# Patient Record
Sex: Female | Born: 1965
Health system: Southern US, Community
[De-identification: ages and names within clinical notes are randomized; demographics above are authoritative.]

## PROBLEM LIST (undated history)

## (undated) DIAGNOSIS — Z8709 Personal history of other diseases of the respiratory system: Secondary | ICD-10-CM

## (undated) DIAGNOSIS — F32A Depression, unspecified: Secondary | ICD-10-CM

## (undated) DIAGNOSIS — R05 Cough: Secondary | ICD-10-CM

## (undated) DIAGNOSIS — F329 Major depressive disorder, single episode, unspecified: Secondary | ICD-10-CM

## (undated) DIAGNOSIS — R0981 Nasal congestion: Secondary | ICD-10-CM

## (undated) DIAGNOSIS — S8263XA Displaced fracture of lateral malleolus of unspecified fibula, initial encounter for closed fracture: Secondary | ICD-10-CM

## (undated) HISTORY — PX: TUBAL LIGATION: SHX77

---

## 2009-05-14 ENCOUNTER — Emergency Department (HOSPITAL_BASED_OUTPATIENT_CLINIC_OR_DEPARTMENT_OTHER): Admission: EM | Admit: 2009-05-14 | Discharge: 2009-05-14 | Payer: Self-pay | Admitting: Emergency Medicine

## 2010-05-31 ENCOUNTER — Emergency Department (HOSPITAL_BASED_OUTPATIENT_CLINIC_OR_DEPARTMENT_OTHER)
Admission: EM | Admit: 2010-05-31 | Discharge: 2010-05-31 | Disposition: A | Discharge status: Home / Self Care | Payer: Self-pay | Attending: Emergency Medicine | Admitting: Emergency Medicine

## 2010-05-31 DIAGNOSIS — Y9239 Other specified sports and athletic area as the place of occurrence of the external cause: Secondary | ICD-10-CM | POA: Insufficient documentation

## 2010-05-31 DIAGNOSIS — Y92838 Other recreation area as the place of occurrence of the external cause: Secondary | ICD-10-CM | POA: Insufficient documentation

## 2010-05-31 DIAGNOSIS — IMO0002 Reserved for concepts with insufficient information to code with codable children: Secondary | ICD-10-CM | POA: Insufficient documentation

## 2010-05-31 DIAGNOSIS — Y93A1 Activity, exercise machines primarily for cardiorespiratory conditioning: Secondary | ICD-10-CM | POA: Insufficient documentation

## 2010-05-31 DIAGNOSIS — X503XXA Overexertion from repetitive movements, initial encounter: Secondary | ICD-10-CM | POA: Insufficient documentation

## 2010-06-10 LAB — WET PREP, GENITAL: Yeast Wet Prep HPF POC: NONE SEEN

## 2010-06-10 LAB — URINALYSIS, ROUTINE W REFLEX MICROSCOPIC
Hgb urine dipstick: NEGATIVE
Specific Gravity, Urine: 1.021 (ref 1.005–1.030)
Urobilinogen, UA: 1 mg/dL (ref 0.0–1.0)
pH: 6 (ref 5.0–8.0)

## 2010-06-10 LAB — GC/CHLAMYDIA PROBE AMP, GENITAL
Chlamydia, DNA Probe: NEGATIVE
GC Probe Amp, Genital: NEGATIVE

## 2012-05-09 ENCOUNTER — Emergency Department (HOSPITAL_BASED_OUTPATIENT_CLINIC_OR_DEPARTMENT_OTHER)
Admission: EM | Admit: 2012-05-09 | Discharge: 2012-05-09 | Disposition: A | Discharge status: Home / Self Care | Payer: Self-pay | Attending: Emergency Medicine | Admitting: Emergency Medicine

## 2012-05-09 ENCOUNTER — Encounter (HOSPITAL_BASED_OUTPATIENT_CLINIC_OR_DEPARTMENT_OTHER): Payer: Self-pay

## 2012-05-09 DIAGNOSIS — IMO0002 Reserved for concepts with insufficient information to code with codable children: Secondary | ICD-10-CM | POA: Insufficient documentation

## 2012-05-09 DIAGNOSIS — H53149 Visual discomfort, unspecified: Secondary | ICD-10-CM | POA: Insufficient documentation

## 2012-05-09 DIAGNOSIS — H5789 Other specified disorders of eye and adnexa: Secondary | ICD-10-CM | POA: Insufficient documentation

## 2012-05-09 DIAGNOSIS — H1130 Conjunctival hemorrhage, unspecified eye: Secondary | ICD-10-CM | POA: Insufficient documentation

## 2012-05-09 DIAGNOSIS — Y9389 Activity, other specified: Secondary | ICD-10-CM | POA: Insufficient documentation

## 2012-05-09 DIAGNOSIS — H539 Unspecified visual disturbance: Secondary | ICD-10-CM | POA: Insufficient documentation

## 2012-05-09 DIAGNOSIS — Y929 Unspecified place or not applicable: Secondary | ICD-10-CM | POA: Insufficient documentation

## 2012-05-09 MED ORDER — FLUORESCEIN SODIUM 1 MG OP STRP
ORAL_STRIP | OPHTHALMIC | Status: AC
  Administered 2012-05-09: 12:00:00
  Filled 2012-05-09: qty 1

## 2012-05-09 MED ORDER — TETRACAINE HCL 0.5 % OP SOLN
OPHTHALMIC | Status: AC
  Administered 2012-05-09: 12:00:00
  Filled 2012-05-09: qty 2
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
## 2012-05-09 NOTE — ED Notes (Signed)
Supplies placed at bedside for md exam. md at bedside.
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
## 2012-07-20 ENCOUNTER — Encounter (HOSPITAL_BASED_OUTPATIENT_CLINIC_OR_DEPARTMENT_OTHER): Payer: Self-pay | Admitting: *Deleted

## 2012-07-20 ENCOUNTER — Emergency Department (HOSPITAL_BASED_OUTPATIENT_CLINIC_OR_DEPARTMENT_OTHER)
Admission: EM | Admit: 2012-07-20 | Discharge: 2012-07-21 | Disposition: A | Discharge status: Home / Self Care | Payer: Self-pay | Attending: Emergency Medicine | Admitting: Emergency Medicine

## 2012-07-20 DIAGNOSIS — R072 Precordial pain: Secondary | ICD-10-CM | POA: Insufficient documentation

## 2012-07-20 DIAGNOSIS — R110 Nausea: Secondary | ICD-10-CM | POA: Insufficient documentation

## 2012-07-20 DIAGNOSIS — R350 Frequency of micturition: Secondary | ICD-10-CM | POA: Insufficient documentation

## 2012-07-20 DIAGNOSIS — R1013 Epigastric pain: Secondary | ICD-10-CM | POA: Insufficient documentation

## 2012-07-20 DIAGNOSIS — G8929 Other chronic pain: Secondary | ICD-10-CM | POA: Insufficient documentation

## 2012-07-20 DIAGNOSIS — R0602 Shortness of breath: Secondary | ICD-10-CM | POA: Insufficient documentation

## 2012-07-20 DIAGNOSIS — R61 Generalized hyperhidrosis: Secondary | ICD-10-CM | POA: Insufficient documentation

## 2012-07-20 DIAGNOSIS — Z880 Allergy status to penicillin: Secondary | ICD-10-CM | POA: Insufficient documentation

## 2012-07-20 DIAGNOSIS — Z3202 Encounter for pregnancy test, result negative: Secondary | ICD-10-CM | POA: Insufficient documentation

## 2012-07-20 DIAGNOSIS — R079 Chest pain, unspecified: Secondary | ICD-10-CM

## 2012-07-20 DIAGNOSIS — M25519 Pain in unspecified shoulder: Secondary | ICD-10-CM | POA: Insufficient documentation

## 2012-07-20 NOTE — ED Notes (Signed)
Pt states she had just driven back from [facility] and was trying to lie down and go to sleep, but could not. Felt pressure in chest. +nausea. Under stress. No other s/s. Cycle was 3 weeks late. Hx tubal ligation.

## 2012-07-21 ENCOUNTER — Emergency Department (HOSPITAL_BASED_OUTPATIENT_CLINIC_OR_DEPARTMENT_OTHER): Payer: Self-pay

## 2012-07-21 LAB — RAPID URINE DRUG SCREEN, HOSP PERFORMED
Amphetamines: NOT DETECTED
Barbiturates: NOT DETECTED
Benzodiazepines: NOT DETECTED
Cocaine: NOT DETECTED

## 2012-07-21 LAB — URINE MICROSCOPIC-ADD ON

## 2012-07-21 LAB — CBC WITH DIFFERENTIAL/PLATELET
Basophils Relative: 0 % (ref 0–1)
Eosinophils Absolute: 0.2 10*3/uL (ref 0.0–0.7)
HCT: 35.3 % — ABNORMAL LOW (ref 36.0–46.0)
Hemoglobin: 12 g/dL (ref 12.0–15.0)
MCH: 29.9 pg (ref 26.0–34.0)
MCHC: 34 g/dL (ref 30.0–36.0)
Monocytes Absolute: 0.3 10*3/uL (ref 0.1–1.0)
Monocytes Relative: 5 % (ref 3–12)
Neutrophils Relative %: 62 % (ref 43–77)

## 2012-07-21 LAB — COMPREHENSIVE METABOLIC PANEL
Albumin: 3.4 g/dL — ABNORMAL LOW (ref 3.5–5.2)
BUN: 9 mg/dL (ref 6–23)
Creatinine, Ser: 0.8 mg/dL (ref 0.50–1.10)
Total Protein: 7.7 g/dL (ref 6.0–8.3)

## 2012-07-21 LAB — URINALYSIS, ROUTINE W REFLEX MICROSCOPIC
Bilirubin Urine: NEGATIVE
Nitrite: NEGATIVE
Specific Gravity, Urine: 1.007 (ref 1.005–1.030)
Urobilinogen, UA: 1 mg/dL (ref 0.0–1.0)

## 2012-07-21 LAB — PREGNANCY, URINE: Preg Test, Ur: NEGATIVE

## 2012-07-21 LAB — LIPASE, BLOOD: Lipase: 31 U/L (ref 11–59)

## 2012-07-21 LAB — TROPONIN I
Troponin I: <0.3 ng/mL (ref <0.30)
Troponin I: <0.3 ng/mL (ref <0.30)

## 2012-07-21 MED ORDER — GI COCKTAIL ~~LOC~~
30.0000 mL | Freq: Once | ORAL | Status: AC
  Administered 2012-07-21: 30 mL via ORAL
  Filled 2012-07-21: qty 30

## 2012-07-21 MED ORDER — ASPIRIN 81 MG PO CHEW
324.0000 mg | CHEWABLE_TABLET | Freq: Once | ORAL | Status: AC
  Administered 2012-07-21: 324 mg via ORAL
  Filled 2012-07-21: qty 4

## 2012-07-21 MED ORDER — METAXALONE 800 MG PO TABS: 800.0000 mg | ORAL_TABLET | Freq: Three times a day (TID) | ORAL | Status: DC | PRN

## 2012-07-21 NOTE — ED Provider Notes (Signed)
History  This chart was scribed for Hanley Seamen, MD, by Candelaria Stagers, ED Scribe. This patient was seen in room MH06/MH06 and the patient's care was started at 12:29 AM   CSN: [id number]  Arrival date & time 07/20/12  2308   First MD Initiated Contact with Patient 07/21/12 0028      Chief Complaint  Patient presents with  . Chest Pain     The history is provided by the patient. No language interpreter was used.   Erica Cisneros is a 47 y.o. female who presents to the Emergency Department via EMS complaining of an episode of vaguely-characterized epigastric and substernal chest pain that started about two hours ago and has now subsided. It was moderate in severity. She reports she also experienced transient nausea, SOB and diaphoresis when the chest pain began. She was lying down when the pain started. She denies emesis. Pt is also complaining of chronic right shoulder pain, worse with movement, that started several weeks ago and is worse at night, waking her from her sleep. Pt reports nothing made the chest pain better or worse. Pt reports she has been more stressed than usual.She states her menstrual period is 3 weeks late.   History reviewed. No pertinent past medical history.  Past Surgical History  Procedure Laterality Date  . Tubal ligation      History reviewed. No pertinent family history.  History  Substance Use Topics  . Smoking status: Never Smoker   . Smokeless tobacco: Not on file  . Alcohol Use: Yes     Comment: social    OB History   Grav Para Term Preterm Abortions TAB SAB Ect Mult Living                  Review of Systems  Constitutional:       Frequent thirst.   Genitourinary: Positive for frequency.  Musculoskeletal: Positive for arthralgias (chronic pain in right shoulder worse at night).  All other systems reviewed and are negative.    Allergies  Penicillins  Home Medications  No current outpatient prescriptions on file.  BP 136/71   Pulse 68  Temp(Src) 98.4 F (36.9 C) (Oral)  Resp 20  Ht 5\' 3"  (1.6 m)  Wt 170 lb (77.111 kg)  BMI 30.12 kg/m2  SpO2 98%  LMP 07/12/2012  Physical Exam  Nursing note and vitals reviewed. Constitutional: She is oriented to person, place, and time. She appears well-developed and well-nourished. No distress.  HENT:  Head: Normocephalic and atraumatic.  Eyes: Conjunctivae are normal. Right eye exhibits no discharge. Left eye exhibits no discharge.  Neck: Normal range of motion. Neck supple.  Pulmonary/Chest: Effort normal. No respiratory distress. She has no wheezes. She has no rales.  Abdominal: There is tenderness (epigastric tenderness).  Musculoskeletal:  Pain in right shoulder with passive movement.   Neurological: She is alert and oriented to person, place, and time.  Skin: Skin is warm and dry.  Psychiatric:  Circumstantial     ED Course  Procedures   DIAGNOSTIC STUDIES: Oxygen Saturation is 98% on room air, normal by my interpretation.    COORDINATION OF CARE:  12:37 AM Discussed course of care with pt.  Pt understands and agrees.      MDM   Nursing notes and vitals signs, including pulse oximetry, reviewed.  Summary of this visit's results, reviewed by myself:  Labs:  Results for orders placed during the hospital encounter of 07/20/12 (from the past 24 hour(s))  CBC  WITH DIFFERENTIAL     Status: Abnormal   Collection Time    07/21/12  1:15 AM      Result Value Range   WBC 5.5  4.0 - 10.5 K/uL   RBC 4.02  3.87 - 5.11 MIL/uL   Hemoglobin 12.0  12.0 - 15.0 g/dL   HCT 40.9 (*) 81.1 - 91.4 %   MCV 87.8  78.0 - 100.0 fL   MCH 29.9  26.0 - 34.0 pg   MCHC 34.0  30.0 - 36.0 g/dL   RDW 78.2  95.6 - 21.3 %   Platelets 197  150 - 400 K/uL   Neutrophils Relative 62  43 - 77 %   Neutro Abs 3.4  1.7 - 7.7 K/uL   Lymphocytes Relative 29  12 - 46 %   Lymphs Abs 1.6  0.7 - 4.0 K/uL   Monocytes Relative 5  3 - 12 %   Monocytes Absolute 0.3  0.1 - 1.0 K/uL    Eosinophils Relative 4  0 - 5 %   Eosinophils Absolute 0.2  0.0 - 0.7 K/uL   Basophils Relative 0  0 - 1 %   Basophils Absolute 0.0  0.0 - 0.1 K/uL  COMPREHENSIVE METABOLIC PANEL     Status: Abnormal   Collection Time    07/21/12  1:15 AM      Result Value Range   Sodium 137  135 - 145 mEq/L   Potassium 3.7  3.5 - 5.1 mEq/L   Chloride 102  96 - 112 mEq/L   CO2 25  19 - 32 mEq/L   Glucose, Bld 106 (*) 70 - 99 mg/dL   BUN 9  6 - 23 mg/dL   Creatinine, Ser 0.86  0.50 - 1.10 mg/dL   Calcium 8.7  8.4 - 57.8 mg/dL   Total Protein 7.7  6.0 - 8.3 g/dL   Albumin 3.4 (*) 3.5 - 5.2 g/dL   AST 22  0 - 37 U/L   ALT 15  0 - 35 U/L   Alkaline Phosphatase 66  39 - 117 U/L   Total Bilirubin 0.2 (*) 0.3 - 1.2 mg/dL   GFR calc non Af Amer 86 (*) >90 mL/min   GFR calc Af Amer >90  >90 mL/min  TROPONIN I     Status: None   Collection Time    07/21/12  1:15 AM      Result Value Range   Troponin I <0.30  <0.30 ng/mL  LIPASE, BLOOD     Status: None   Collection Time    07/21/12  1:15 AM      Result Value Range   Lipase 31  11 - 59 U/L  URINALYSIS, ROUTINE W REFLEX MICROSCOPIC     Status: Abnormal   Collection Time    07/21/12  1:15 AM      Result Value Range   Color, Urine YELLOW  YELLOW   APPearance CLEAR  CLEAR   Specific Gravity, Urine 1.007  1.005 - 1.030   pH 7.0  5.0 - 8.0   Glucose, UA NEGATIVE  NEGATIVE mg/dL   Hgb urine dipstick TRACE (*) NEGATIVE   Bilirubin Urine NEGATIVE  NEGATIVE   Ketones, ur NEGATIVE  NEGATIVE mg/dL   Protein, ur NEGATIVE  NEGATIVE mg/dL   Urobilinogen, UA 1.0  0.0 - 1.0 mg/dL   Nitrite NEGATIVE  NEGATIVE   Leukocytes, UA NEGATIVE  NEGATIVE  PREGNANCY, URINE     Status: None   Collection Time  07/21/12  1:15 AM      Result Value Range   Preg Test, Ur NEGATIVE  NEGATIVE  URINE RAPID DRUG SCREEN (HOSP PERFORMED)     Status: None   Collection Time    07/21/12  1:15 AM      Result Value Range   Opiates NONE DETECTED  NONE DETECTED   Cocaine NONE  DETECTED  NONE DETECTED   Benzodiazepines NONE DETECTED  NONE DETECTED   Amphetamines NONE DETECTED  NONE DETECTED   Tetrahydrocannabinol NONE DETECTED  NONE DETECTED   Barbiturates NONE DETECTED  NONE DETECTED  URINE MICROSCOPIC-ADD ON     Status: Abnormal   Collection Time    07/21/12  1:15 AM      Result Value Range   Squamous Epithelial / LPF FEW (*) RARE   WBC, UA 0-2  <3 WBC/hpf   RBC / HPF 0-2  <3 RBC/hpf   Bacteria, UA RARE  RARE  TROPONIN I     Status: None   Collection Time    07/21/12  3:06 AM      Result Value Range   Troponin I <0.30  <0.30 ng/mL    Imaging Studies: Dg Chest 2 View  07/21/2012  *RADIOLOGY REPORT*  Clinical Data: Chest pain  CHEST - 2 VIEW  Comparison: None.  Findings: Hypoaeration with interstitial and vascular crowding. No confluent airspace opacity, pleural effusion, or pneumothorax. Heart size within normal limits for inspiratory technique. No acute osseous finding.  IMPRESSION: Hypoaeration without confluent airspace opacity.   Original Report Authenticated By: Jearld Lesch, M.D.    3:44 AM  the patient has been asymptomatic in the ED except her for her right shoulder which as noted is a chronic problem. She was advised that we recommended admission for cardiac workup. She states she is unable to do this but would be willing to followup as an outpatient. Will refer her to [facility] cardiology. As with any patient leaving AGAINST MEDICAL ADVICE she was advised that she should feel free to return at any time should she feel worse or change her mind. She was advised that this chest pain may represent a serious problem that could be potentially be life threatening.     Date: 05/032014 1116M  Rate: 72 Rhythm: normal sinus rhythm  QRS Axis: normal  Intervals: normal  ST/T Wave abnormalities: normal  Conduction Disutrbances: none  Narrative Interpretation: unremarkable  Comparison with previous EKG: unchanged       I personally performed the  services described in this documentation, which was scribed in my presence.  The recorded information has been reviewed and is accurate.       Hanley Seamen, MD 07/21/12 [phone]

## 2014-10-16 ENCOUNTER — Encounter (HOSPITAL_BASED_OUTPATIENT_CLINIC_OR_DEPARTMENT_OTHER): Payer: Self-pay

## 2014-10-16 ENCOUNTER — Emergency Department (HOSPITAL_BASED_OUTPATIENT_CLINIC_OR_DEPARTMENT_OTHER)
Admission: EM | Admit: 2014-10-16 | Discharge: 2014-10-16 | Disposition: A | Discharge status: Home / Self Care | Payer: No Typology Code available for payment source | Attending: Emergency Medicine | Admitting: Emergency Medicine

## 2014-10-16 DIAGNOSIS — S46911A Strain of unspecified muscle, fascia and tendon at shoulder and upper arm level, right arm, initial encounter: Secondary | ICD-10-CM | POA: Insufficient documentation

## 2014-10-16 DIAGNOSIS — Z880 Allergy status to penicillin: Secondary | ICD-10-CM | POA: Diagnosis not present

## 2014-10-16 DIAGNOSIS — Z8659 Personal history of other mental and behavioral disorders: Secondary | ICD-10-CM | POA: Diagnosis not present

## 2014-10-16 DIAGNOSIS — Y9289 Other specified places as the place of occurrence of the external cause: Secondary | ICD-10-CM | POA: Insufficient documentation

## 2014-10-16 DIAGNOSIS — Y9389 Activity, other specified: Secondary | ICD-10-CM | POA: Diagnosis not present

## 2014-10-16 DIAGNOSIS — X58XXXA Exposure to other specified factors, initial encounter: Secondary | ICD-10-CM | POA: Diagnosis not present

## 2014-10-16 DIAGNOSIS — Y998 Other external cause status: Secondary | ICD-10-CM | POA: Insufficient documentation

## 2014-10-16 DIAGNOSIS — S4991XA Unspecified injury of right shoulder and upper arm, initial encounter: Secondary | ICD-10-CM | POA: Diagnosis present

## 2014-10-16 HISTORY — DX: Depression, unspecified: F32.A

## 2014-10-16 HISTORY — DX: Major depressive disorder, single episode, unspecified: F32.9

## 2014-10-16 MED ORDER — CYCLOBENZAPRINE HCL 10 MG PO TABS
5.0000 mg | ORAL_TABLET | Freq: Once | ORAL | Status: AC
  Administered 2014-10-16: 5 mg via ORAL
  Filled 2014-10-16: qty 1

## 2014-10-16 MED ORDER — DICLOFENAC SODIUM 1 % TD GEL: 4.0000 g | Freq: Four times a day (QID) | TRANSDERMAL | Status: DC

## 2014-10-16 MED ORDER — CYCLOBENZAPRINE HCL 5 MG PO TABS: 5.0000 mg | ORAL_TABLET | Freq: Three times a day (TID) | ORAL | Status: DC | PRN

## 2014-10-16 NOTE — Discharge Instructions (Signed)
Take motrin for pain.   Take flexeril for muscle spasms.  Continue voltaren cream.   Follow up with Dr. Pearletha Forge for treatment options.   No heavy lifting.   Return to ER if you have worse shoulder pain, weakness, numbness.

## 2014-10-16 NOTE — ED Provider Notes (Signed)
CSN: [id number]     Arrival date & time 10/16/14  0726 History   First MD Initiated Contact with Patient 10/16/14 [phone]     Chief Complaint  Patient presents with  . Shoulder Pain     (Consider location/radiation/quality/duration/timing/severity/associated sxs/prior Treatment) The history is provided by the patient.  Erica Cisneros is a 49 y.o. female who presenting with right shoulder pain. Pain has been going on for the last 4 months. It is worse when she wakes up and gets better during the day. Some times radiates to her back and neck. Denies any fall or injury. She helped sister lift couch yesterday and had more pain. Has been seeing her doctor and called her doctor yesterday with prescribed muscle relaxant but she did not fill it. Has been taking Motrin with minimal relief. The chest pain or numbness or weakness.     Past Medical History  Diagnosis Date  . Depression    Past Surgical History  Procedure Laterality Date  . Tubal ligation     No family history on file. History  Substance Use Topics  . Smoking status: Never Smoker   . Smokeless tobacco: Not on file  . Alcohol Use: Yes     Comment: occ   OB History    No data available     Review of Systems  Musculoskeletal:       R shoulder pain       Allergies  Penicillins  Home Medications   Prior to Admission medications   Medication Sig Start Date End Date Taking? Authorizing Provider  metaxalone (SKELAXIN) 800 MG tablet Take 1 tablet (800 mg total) by mouth 3 (three) times daily as needed (for muscle spasms). 07/21/12   John Molpus, MD   BP 159/94 mmHg  Pulse 70  Temp(Src) 98.5 F (36.9 C) (Oral)  Resp 18  Ht  (1.575 m)  Wt 185 lb (83.915 kg)  BMI 33.83 kg/m2  SpO2 100%  LMP 05/19/2014 Physical Exam  Constitutional: She is oriented to person, place, and time. She appears well-developed.  Uncomfortable   HENT:  Head: Normocephalic.  Mouth/Throat: Oropharynx is clear and moist.  Eyes: Pupils are  equal, round, and reactive to light.  Neck: Normal range of motion.  Cardiovascular: Normal rate.   Pulmonary/Chest: Effort normal and breath sounds normal.  Abdominal: Soft. Bowel sounds are normal.  Musculoskeletal:  R shoulder minimal tenderness on the joint. No deformity. Nl ROM. + trapezius tenderness. 2+ radial pulse, nl neurovascular exam R UE   Neurological: She is alert and oriented to person, place, and time. No cranial nerve deficit. Coordination normal.  Skin: Skin is warm and dry.  Psychiatric: She has a normal mood and affect. Her behavior is normal. Judgment and thought content normal.  Nursing note and vitals reviewed.   ED Course  Procedures (including critical care time) Labs Review Labs Reviewed - No data to display  Imaging Review No results found.   EKG Interpretation None      MDM   Final diagnoses:  None    Erica Cisneros is a 49 y.o. female here with R shoulder pain. Likely muscle strain vs bursitis. Recommend continue motrin. Will add flexeril. Patient requests Voltaren cream for comfort. Will refer to ortho for injections.      Richardean Canal, MD 10/16/14 [phone]

## 2014-10-16 NOTE — ED Notes (Signed)
MD at bedside. 
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
## 2015-02-12 ENCOUNTER — Encounter (HOSPITAL_BASED_OUTPATIENT_CLINIC_OR_DEPARTMENT_OTHER): Payer: Self-pay

## 2015-02-12 ENCOUNTER — Emergency Department (HOSPITAL_BASED_OUTPATIENT_CLINIC_OR_DEPARTMENT_OTHER)
Admission: EM | Admit: 2015-02-12 | Discharge: 2015-02-12 | Disposition: A | Discharge status: Home / Self Care | Payer: No Typology Code available for payment source | Attending: Emergency Medicine | Admitting: Emergency Medicine

## 2015-02-12 DIAGNOSIS — Z8659 Personal history of other mental and behavioral disorders: Secondary | ICD-10-CM | POA: Diagnosis not present

## 2015-02-12 DIAGNOSIS — Y998 Other external cause status: Secondary | ICD-10-CM | POA: Insufficient documentation

## 2015-02-12 DIAGNOSIS — M545 Low back pain, unspecified: Secondary | ICD-10-CM

## 2015-02-12 DIAGNOSIS — S46911A Strain of unspecified muscle, fascia and tendon at shoulder and upper arm level, right arm, initial encounter: Secondary | ICD-10-CM | POA: Insufficient documentation

## 2015-02-12 DIAGNOSIS — Y92410 Unspecified street and highway as the place of occurrence of the external cause: Secondary | ICD-10-CM | POA: Diagnosis not present

## 2015-02-12 DIAGNOSIS — S3992XA Unspecified injury of lower back, initial encounter: Secondary | ICD-10-CM | POA: Insufficient documentation

## 2015-02-12 DIAGNOSIS — Z880 Allergy status to penicillin: Secondary | ICD-10-CM | POA: Diagnosis not present

## 2015-02-12 DIAGNOSIS — Y9389 Activity, other specified: Secondary | ICD-10-CM | POA: Insufficient documentation

## 2015-02-12 MED ORDER — METHOCARBAMOL 500 MG PO TABS: 1000.0000 mg | ORAL_TABLET | Freq: Four times a day (QID) | ORAL | Status: DC
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
## 2015-02-12 NOTE — ED Notes (Signed)
PA at bedside.

## 2016-04-22 ENCOUNTER — Encounter (HOSPITAL_BASED_OUTPATIENT_CLINIC_OR_DEPARTMENT_OTHER): Payer: Self-pay | Admitting: Emergency Medicine

## 2016-04-22 ENCOUNTER — Emergency Department (HOSPITAL_BASED_OUTPATIENT_CLINIC_OR_DEPARTMENT_OTHER): Payer: Managed Care, Other (non HMO)

## 2016-04-22 ENCOUNTER — Emergency Department (HOSPITAL_BASED_OUTPATIENT_CLINIC_OR_DEPARTMENT_OTHER)
Admission: EM | Admit: 2016-04-22 | Discharge: 2016-04-22 | Disposition: A | Discharge status: Home / Self Care | Payer: Managed Care, Other (non HMO) | Attending: Emergency Medicine | Admitting: Emergency Medicine

## 2016-04-22 DIAGNOSIS — Y939 Activity, unspecified: Secondary | ICD-10-CM | POA: Insufficient documentation

## 2016-04-22 DIAGNOSIS — Y999 Unspecified external cause status: Secondary | ICD-10-CM | POA: Diagnosis not present

## 2016-04-22 DIAGNOSIS — W19XXXA Unspecified fall, initial encounter: Secondary | ICD-10-CM | POA: Diagnosis not present

## 2016-04-22 DIAGNOSIS — S99912A Unspecified injury of left ankle, initial encounter: Secondary | ICD-10-CM | POA: Diagnosis present

## 2016-04-22 DIAGNOSIS — S82839A Other fracture of upper and lower end of unspecified fibula, initial encounter for closed fracture: Secondary | ICD-10-CM

## 2016-04-22 DIAGNOSIS — Y929 Unspecified place or not applicable: Secondary | ICD-10-CM | POA: Diagnosis not present

## 2016-04-22 DIAGNOSIS — S82832A Other fracture of upper and lower end of left fibula, initial encounter for closed fracture: Secondary | ICD-10-CM | POA: Diagnosis not present

## 2016-04-22 DIAGNOSIS — S8263XA Displaced fracture of lateral malleolus of unspecified fibula, initial encounter for closed fracture: Secondary | ICD-10-CM

## 2016-04-22 HISTORY — DX: Displaced fracture of lateral malleolus of unspecified fibula, initial encounter for closed fracture: S82.63XA

## 2016-04-22 MED ORDER — NAPROXEN 500 MG PO TABS: 500.0000 mg | ORAL_TABLET | Freq: Two times a day (BID) | ORAL | 0 refills | Status: DC

## 2016-04-22 MED ORDER — HYDROCODONE-ACETAMINOPHEN 5-325 MG PO TABS: ORAL_TABLET | ORAL | 0 refills | Status: DC

## 2016-04-22 NOTE — ED Notes (Signed)
pT AND HUSBAND GIVEN D/C INSTRUCTIONS AS PER CHART. vERBALIZES UNDERSTANDING. nO QUESTIONS. rX X 2 WITH PRECATIONS.

## 2016-04-22 NOTE — Discharge Instructions (Signed)
Please read and follow all provided instructions.  Your diagnoses today include:  1. Closed fracture of distal end of fibula, unspecified fracture morphology, initial encounter     Tests performed today include:  An x-ray of the affected area - shows broken fibula  Vital signs. See below for your results today.   Medications prescribed:   Vicodin (hydrocodone/acetaminophen) - narcotic pain medication  DO NOT drive or perform any activities that require you to be awake and alert because this medicine can make you drowsy. BE VERY CAREFUL not to take multiple medicines containing Tylenol (also called acetaminophen). Doing so can lead to an overdose which can damage your liver and cause liver failure and possibly death.   Naproxen - anti-inflammatory pain medication  Do not exceed 500mg  naproxen every 12 hours, take with food  You have been prescribed an anti-inflammatory medication or NSAID. Take with food. Take smallest effective dose for the shortest duration needed for your pain. Stop taking if you experience stomach pain or vomiting.   Take any prescribed medications only as directed.  Home care instructions:   Follow any educational materials contained in this packet  Follow R.I.C.E. Protocol:  R - rest your injury   I  - use ice on injury without applying directly to skin  C - compress injury with bandage or splint  E - elevate the injury as much as possible  Follow-up instructions: Please follow-up with the provided orthopedic physician (bone specialist) this coming week.   Return instructions:   Please return if your toes or feet are numb or tingling, appear gray or blue, or you have severe pain (also elevate the leg and loosen splint or wrap if you were given one)  Please return to the Emergency Department if you experience worsening symptoms.   Please return if you have any other emergent concerns.  Additional Information:  Your vital signs today were: BP  143/94 (BP Location: Right Arm)    Pulse 69    Temp 98.2 F (36.8 C) (Oral)    Resp 18    Ht 5\' 2"  (1.575 m)    Wt 89.8 kg    SpO2 100%    BMI 36.21 kg/m  If your blood pressure (BP) was elevated above 135/85 this visit, please have this repeated by your doctor within one month. --------------

## 2016-04-22 NOTE — ED Triage Notes (Signed)
Patient states that she fell today and hurt her left ankle and right thigh when she fell

## 2016-04-22 NOTE — ED Provider Notes (Signed)
[facility] Note   CSN: [id number] Arrival date & time: 04/22/16  1550  By signing my name below, I, Talbert Nan, attest that this documentation has been prepared under the direction and in the presence of Josh Janita Camberos PA-C. Electronically Signed: Talbert Nan, Scribe. 04/22/16. 4:34 PM.   History   Chief Complaint Chief Complaint  Patient presents with  . Ankle Pain    HPI Erica Cisneros is a 51 y.o. female who presents to the Emergency Department complaining of acute onset moderate-sever left ankle pain that started today s/p stepping down into a/c vent in floor. Pt has associated left thigh pain, right clavicular pain and abrasions to right thigh. Pt has taken Aleve PTA with limited relief.  The history is provided by the patient. No language interpreter was used.    Past Medical History:  Diagnosis Date  . Depression     There are no active problems to display for this patient.   Past Surgical History:  Procedure Laterality Date  . TUBAL LIGATION      OB History    No data available       Home Medications    Prior to Admission medications   Medication Sig Start Date End Date Taking? Authorizing Provider  methocarbamol (ROBAXIN) 500 MG tablet Take 2 tablets (1,000 mg total) by mouth 4 (four) times daily. 02/12/15   Renne Crigler, PA-C    Family History History reviewed. No pertinent family history.  Social History Social History  Substance Use Topics  . Smoking status: Never Smoker  . Smokeless tobacco: Never Used  . Alcohol use Yes     Comment: occ     Allergies   Penicillins   Review of Systems Review of Systems  Constitutional: Negative for activity change.  Musculoskeletal: Positive for arthralgias and myalgias. Negative for back pain, joint swelling and neck pain.  Skin: Negative for wound.  Neurological: Negative for weakness and numbness.     Physical Exam Updated Vital Signs BP 143/94 (BP Location: Right Arm)    Pulse 69   Temp 98.2 F (36.8 C) (Oral)   Resp 18   Ht 5\' 2"  (1.575 m)   Wt 198 lb (89.8 kg)   SpO2 100%   BMI 36.21 kg/m   Physical Exam  Constitutional: She appears well-developed and well-nourished.  HENT:  Head: Normocephalic and atraumatic.  Eyes: Pupils are equal, round, and reactive to light.  Neck: Normal range of motion. Neck supple.  Cardiovascular: Normal rate and normal pulses.  Exam reveals no decreased pulses.   Pulmonary/Chest: Effort normal.  Musculoskeletal: She exhibits tenderness. She exhibits no edema.       Right shoulder: She exhibits tenderness (anterior).       Right elbow: Normal.      Right hip: Normal.       Right knee: Normal.       Left knee: Normal. No tenderness found. No medial joint line and no lateral joint line tenderness noted.       Right ankle: Normal.       Left ankle: She exhibits decreased range of motion and swelling. Tenderness. Lateral malleolus tenderness found. No medial malleolus tenderness found.       Cervical back: Normal.       Arms:      Right upper leg: She exhibits tenderness. She exhibits no bony tenderness and no swelling.       Left lower leg: Normal.       Legs:  Right foot: Normal.       Left foot: Normal.  Neurological: She is alert. No sensory deficit.  Motor, sensation, and vascular distal to the injury is fully intact.   Skin: Skin is warm and dry.  Psychiatric: She has a normal mood and affect.  Nursing note and vitals reviewed.    ED Treatments / Results   DIAGNOSTIC STUDIES: Oxygen Saturation is 100% on room air, normal by my interpretation.    COORDINATION OF CARE: 4:32 PM Discussed treatment plan with pt at bedside and pt agreed to plan which includes icing left ankle, XR of left ankle.  Labs (all labs ordered are listed, but only abnormal results are displayed) Labs Reviewed - No data to display  EKG  EKG Interpretation None       Radiology No results found.  Procedures Procedures  (including critical care time)  Medications Ordered in ED Medications - No data to display   Initial Impression / Assessment and Plan / ED Course  I have reviewed the triage vital signs and the nursing notes.  Pertinent labs & imaging results that were available during my care of the patient were reviewed by me and considered in my medical decision making (see chart for details).     Patient seen and examined.    Vital signs reviewed and are as follows: BP 143/94 (BP Location: Right Arm)   Pulse 69   Temp 98.2 F (36.8 C) (Oral)   Resp 18   Ht 5\' 2"  (1.575 m)   Wt 89.8 kg   SpO2 100%   BMI 36.21 kg/m   6:03 PM Pt informed of results. Crutches and CAM walker ordered.   Patient given referral to sports medicine. Pain medicine for home. Work note given. Counseled on rice protocol.  Patient counseled on use of narcotic pain medications. Counseled not to combine these medications with others containing tylenol. Urged not to drink alcohol, drive, or perform any other activities that requires focus while taking these medications. The patient verbalizes understanding and agrees with the plan.   Final Clinical Impressions(s) / ED Diagnoses   Final diagnoses:  Closed fracture of distal end of fibula, unspecified fracture morphology, initial encounter   Patients with distal fibula fracture. Patient also has mild abrasion to the right anterior thigh and right clavicle, however do not suspect fracture in these areas given minimal discomfort. Patient is ambulatory and can bear weight on her right leg. She can move her right shoulder without difficulty.  New Prescriptions New Prescriptions   HYDROCODONE-ACETAMINOPHEN (NORCO/VICODIN) 5-325 MG TABLET    Take 1-2 tablets every 6 hours as needed for severe pain   NAPROXEN (NAPROSYN) 500 MG TABLET    Take 1 tablet (500 mg total) by mouth 2 (two) times daily.   I personally performed the services described in this documentation, which was  scribed in my presence. The recorded information has been reviewed and is accurate.     Renne CriglerJoshua Earle Troiano, PA-C 04/22/16 1805    Doug SouSam Jacubowitz, MD 04/22/16 2351

## 2016-04-26 ENCOUNTER — Ambulatory Visit (INDEPENDENT_AMBULATORY_CARE_PROVIDER_SITE_OTHER): Payer: Managed Care, Other (non HMO) | Admitting: Family Medicine

## 2016-04-26 ENCOUNTER — Encounter: Payer: Self-pay | Admitting: Family Medicine

## 2016-04-26 ENCOUNTER — Ambulatory Visit (HOSPITAL_BASED_OUTPATIENT_CLINIC_OR_DEPARTMENT_OTHER)
Admission: RE | Admit: 2016-04-26 | Discharge: 2016-04-26 | Disposition: A | Discharge status: Home / Self Care | Payer: Managed Care, Other (non HMO) | Source: Ambulatory Visit | Attending: Family Medicine | Admitting: Family Medicine

## 2016-04-26 VITALS — BP 113/80 | HR 75 | Ht 64.0 in | Wt 190.0 lb

## 2016-04-26 DIAGNOSIS — M545 Low back pain, unspecified: Secondary | ICD-10-CM

## 2016-04-26 DIAGNOSIS — M160 Bilateral primary osteoarthritis of hip: Secondary | ICD-10-CM | POA: Diagnosis not present

## 2016-04-26 DIAGNOSIS — S99912A Unspecified injury of left ankle, initial encounter: Secondary | ICD-10-CM

## 2016-04-26 MED ORDER — OXYCODONE-ACETAMINOPHEN 7.5-325 MG PO TABS: 1.0000 | ORAL_TABLET | ORAL | 0 refills | Status: DC | PRN

## 2016-04-26 MED ORDER — METHOCARBAMOL 500 MG PO TABS: 500.0000 mg | ORAL_TABLET | Freq: Three times a day (TID) | ORAL | 1 refills | Status: DC | PRN

## 2016-04-26 MED ORDER — DICLOFENAC SODIUM 75 MG PO TBEC: 75.0000 mg | DELAYED_RELEASE_TABLET | Freq: Two times a day (BID) | ORAL | 1 refills | Status: DC

## 2016-04-26 NOTE — Patient Instructions (Signed)
Get x-rays of your low back as you leave today - we will call you with these results. Assuming these are normal you strained your low back. Take diclofenac 75mg  twice a day with food for pain and inflammation. Robaxin as needed for muscle spasms. Oxycodone as needed for severe pain (no driving on this medicine).  You have a comminuted distal fibula fracture of your lower leg. I will touch base with the orthopedic surgeon about your case. Wear the cam walker and use crutches as you have been. Elevate it above your heart as much as possible. Icing 15 minutes at a time 3-4 times a day. Follow up with me in 1 1/2 weeks.

## 2016-04-27 DIAGNOSIS — M545 Low back pain, unspecified: Secondary | ICD-10-CM | POA: Insufficient documentation

## 2016-04-27 DIAGNOSIS — S99912A Unspecified injury of left ankle, initial encounter: Secondary | ICD-10-CM | POA: Insufficient documentation

## 2016-04-27 NOTE — Assessment & Plan Note (Signed)
independently reviewed radiographs and no acute findings.  2/2 strain.  Diclofenac with robaxin, oxycodone if needed.  Home exercises reviewed.  F/u in 1 1/2 week for both issues.

## 2016-04-27 NOTE — Progress Notes (Signed)
PCP: No PCP Per Patient  Subjective:   HPI: Patient is a 51 y.o. female here for left ankle injury.  Patient reports on 2/3 she fell through a hole in the floor with her right leg. There was a vent cover here that was taken off causing her to fall through. Left leg was still planted next to this when she went. Caused severe pain lateral left ankle, swelling, bruising. Inability to bear weight. Also with pain lower back bilaterally. Saw chiropractor previously for problems with her low back and reports she improved. Has been wearing cam walker for ankle. Pain level 10/10 and sharp. Taking hydrocodone for pain but not helping much. No prior ankle injuries.  Past Medical History:  Diagnosis Date  . Depression     No current outpatient prescriptions on file prior to visit.   No current facility-administered medications on file prior to visit.     Past Surgical History:  Procedure Laterality Date  . TUBAL LIGATION      Allergies  Allergen Reactions  . Penicillins Rash    Social History   Social History  . Marital status: Legally Separated    Spouse name: N/A  . Number of children: N/A  . Years of education: N/A   Occupational History  . Not on file.   Social History Main Topics  . Smoking status: Never Smoker  . Smokeless tobacco: Never Used  . Alcohol use Yes     Comment: occ  . Drug use: No  . Sexual activity: Yes    Birth control/ protection: Surgical   Other Topics Concern  . Not on file   Social History Narrative  . No narrative on file    No family history on file.  BP 113/80   Pulse 75   Ht 5\' 4"  (1.626 m)   Wt 190 lb (86.2 kg)   BMI 32.61 kg/m   Review of Systems: See HPI above.     Objective:  Physical Exam:  Gen: NAD, comfortable in exam room  Left ankle: Mod swelling, bruising lateral ankle.  No other deformity. Very limited ROM. TTP lateral malleolus.  No medial ankle tenderness.  No base 5th, navicular tenderness. Deferred  syndesmotic compression. Thompsons test negative. NV intact distally.  Back: No gross deformity, scoliosis. TTP bilateral lumbar paraspinal region.  No midline or bony TTP. FROM. Able to flex, extend knees, flex hips. Negative SLRs. Sensation intact to light touch bilaterally. Negative logroll bilateral hips   Assessment & Plan:  1. Left ankle injury - Independently reviewed radiographs - she has a comminuted distal fibula fracture with three fragments.  On the positive side these do not appear to be displaced.  Will continue with cam walker, minimal weight bearing.  Crutches.  Icing, elevation.  Diclofenac with oxycodone as needed.  Will touch base with ortho as well.  2. Low back pain - independently reviewed radiographs and no acute findings.  2/2 strain.  Diclofenac with robaxin, oxycodone if needed.  Home exercises reviewed.  F/u in 1 1/2 week for both issues.

## 2016-04-27 NOTE — Assessment & Plan Note (Signed)
Independently reviewed radiographs - she has a comminuted distal fibula fracture with three fragments.  On the positive side these do not appear to be displaced.  Will continue with cam walker, minimal weight bearing.  Crutches.  Icing, elevation.  Diclofenac with oxycodone as needed.  Will touch base with ortho as well.

## 2016-05-01 ENCOUNTER — Encounter (HOSPITAL_BASED_OUTPATIENT_CLINIC_OR_DEPARTMENT_OTHER): Payer: Self-pay | Admitting: *Deleted

## 2016-05-01 DIAGNOSIS — R0981 Nasal congestion: Secondary | ICD-10-CM

## 2016-05-01 DIAGNOSIS — R059 Cough, unspecified: Secondary | ICD-10-CM

## 2016-05-01 HISTORY — DX: Cough, unspecified: R05.9

## 2016-05-01 HISTORY — DX: Nasal congestion: R09.81

## 2016-05-02 ENCOUNTER — Other Ambulatory Visit (INDEPENDENT_AMBULATORY_CARE_PROVIDER_SITE_OTHER): Payer: Self-pay | Admitting: Orthopaedic Surgery

## 2016-05-02 DIAGNOSIS — S8262XA Displaced fracture of lateral malleolus of left fibula, initial encounter for closed fracture: Secondary | ICD-10-CM

## 2016-05-03 ENCOUNTER — Ambulatory Visit (HOSPITAL_BASED_OUTPATIENT_CLINIC_OR_DEPARTMENT_OTHER)
Admission: RE | Admit: 2016-05-03 | Discharge: 2016-05-03 | Disposition: A | Discharge status: Home / Self Care | Payer: Managed Care, Other (non HMO) | Source: Ambulatory Visit | Attending: Orthopaedic Surgery | Admitting: Orthopaedic Surgery

## 2016-05-03 ENCOUNTER — Ambulatory Visit (HOSPITAL_BASED_OUTPATIENT_CLINIC_OR_DEPARTMENT_OTHER): Payer: Managed Care, Other (non HMO) | Admitting: Certified Registered"

## 2016-05-03 ENCOUNTER — Ambulatory Visit (HOSPITAL_COMMUNITY): Payer: Managed Care, Other (non HMO)

## 2016-05-03 ENCOUNTER — Encounter (HOSPITAL_BASED_OUTPATIENT_CLINIC_OR_DEPARTMENT_OTHER): Payer: Self-pay | Admitting: Certified Registered"

## 2016-05-03 ENCOUNTER — Encounter (HOSPITAL_BASED_OUTPATIENT_CLINIC_OR_DEPARTMENT_OTHER)
Admission: RE | Disposition: A | Discharge status: Home / Self Care | Payer: Self-pay | Source: Ambulatory Visit | Attending: Orthopaedic Surgery

## 2016-05-03 DIAGNOSIS — Z880 Allergy status to penicillin: Secondary | ICD-10-CM | POA: Diagnosis not present

## 2016-05-03 DIAGNOSIS — X58XXXA Exposure to other specified factors, initial encounter: Secondary | ICD-10-CM | POA: Insufficient documentation

## 2016-05-03 DIAGNOSIS — S8262XA Displaced fracture of lateral malleolus of left fibula, initial encounter for closed fracture: Secondary | ICD-10-CM | POA: Diagnosis not present

## 2016-05-03 DIAGNOSIS — F329 Major depressive disorder, single episode, unspecified: Secondary | ICD-10-CM | POA: Insufficient documentation

## 2016-05-03 DIAGNOSIS — S8263XD Displaced fracture of lateral malleolus of unspecified fibula, subsequent encounter for closed fracture with routine healing: Secondary | ICD-10-CM

## 2016-05-03 DIAGNOSIS — Z419 Encounter for procedure for purposes other than remedying health state, unspecified: Secondary | ICD-10-CM

## 2016-05-03 DIAGNOSIS — S99912A Unspecified injury of left ankle, initial encounter: Secondary | ICD-10-CM

## 2016-05-03 HISTORY — DX: Cough: R05

## 2016-05-03 HISTORY — DX: Personal history of other diseases of the respiratory system: Z87.09

## 2016-05-03 HISTORY — DX: Nasal congestion: R09.81

## 2016-05-03 HISTORY — PX: ORIF ANKLE FRACTURE: SHX5408

## 2016-05-03 HISTORY — DX: Displaced fracture of lateral malleolus of unspecified fibula, initial encounter for closed fracture: S82.63XA

## 2016-05-03 SURGERY — OPEN REDUCTION INTERNAL FIXATION (ORIF) ANKLE FRACTURE
Anesthesia: General | Site: Ankle | Laterality: Left

## 2016-05-03 MED ORDER — SCOPOLAMINE 1 MG/3DAYS TD PT72: 1.0000 | MEDICATED_PATCH | Freq: Once | TRANSDERMAL | Status: DC

## 2016-05-03 MED ORDER — ONDANSETRON HCL 4 MG/2ML IJ SOLN
INTRAMUSCULAR | Status: DC | PRN
  Administered 2016-05-03: 4 mg via INTRAVENOUS

## 2016-05-03 MED ORDER — ONDANSETRON HCL 4 MG PO TABS: 4.0000 mg | ORAL_TABLET | Freq: Three times a day (TID) | ORAL | 0 refills | Status: DC | PRN

## 2016-05-03 MED ORDER — ASPIRIN EC 325 MG PO TBEC: 325.0000 mg | DELAYED_RELEASE_TABLET | Freq: Two times a day (BID) | ORAL | 0 refills | Status: DC

## 2016-05-03 MED ORDER — LACTATED RINGERS IV SOLN
INTRAVENOUS | Status: DC
  Administered 2016-05-03 (×2): via INTRAVENOUS

## 2016-05-03 MED ORDER — CLINDAMYCIN PHOSPHATE 900 MG/50ML IV SOLN
900.0000 mg | INTRAVENOUS | Status: AC
  Administered 2016-05-03: 900 mg via INTRAVENOUS

## 2016-05-03 MED ORDER — LIDOCAINE HCL (CARDIAC) 20 MG/ML IV SOLN
INTRAVENOUS | Status: DC | PRN
  Administered 2016-05-03: 30 mg via INTRAVENOUS

## 2016-05-03 MED ORDER — SENNOSIDES-DOCUSATE SODIUM 8.6-50 MG PO TABS: 1.0000 | ORAL_TABLET | Freq: Every evening | ORAL | 1 refills | Status: DC | PRN

## 2016-05-03 MED ORDER — FENTANYL CITRATE (PF) 100 MCG/2ML IJ SOLN
50.0000 ug | INTRAMUSCULAR | Status: DC | PRN
  Administered 2016-05-03: 50 ug via INTRAVENOUS

## 2016-05-03 MED ORDER — METHOCARBAMOL 750 MG PO TABS: 750.0000 mg | ORAL_TABLET | Freq: Two times a day (BID) | ORAL | 0 refills | Status: DC | PRN

## 2016-05-03 MED ORDER — OXYCODONE-ACETAMINOPHEN 5-325 MG PO TABS: 1.0000 | ORAL_TABLET | ORAL | 0 refills | Status: DC | PRN

## 2016-05-03 MED ORDER — MIDAZOLAM HCL 2 MG/2ML IJ SOLN
1.0000 mg | INTRAMUSCULAR | Status: DC | PRN
  Administered 2016-05-03: 2 mg via INTRAVENOUS

## 2016-05-03 MED ORDER — CLINDAMYCIN PHOSPHATE 900 MG/50ML IV SOLN
INTRAVENOUS | Status: AC
  Filled 2016-05-03: qty 50

## 2016-05-03 MED ORDER — OXYCODONE HCL ER 10 MG PO T12A: 10.0000 mg | EXTENDED_RELEASE_TABLET | Freq: Two times a day (BID) | ORAL | 0 refills | Status: DC

## 2016-05-03 MED ORDER — FENTANYL CITRATE (PF) 100 MCG/2ML IJ SOLN
INTRAMUSCULAR | Status: AC
  Filled 2016-05-03: qty 2

## 2016-05-03 MED ORDER — PROPOFOL 10 MG/ML IV BOLUS
INTRAVENOUS | Status: DC | PRN
  Administered 2016-05-03: 150 mg via INTRAVENOUS

## 2016-05-03 MED ORDER — BUPIVACAINE-EPINEPHRINE (PF) 0.5% -1:200000 IJ SOLN
INTRAMUSCULAR | Status: DC | PRN
  Administered 2016-05-03: 30 mL via PERINEURAL
  Administered 2016-05-03: 15 mL via PERINEURAL

## 2016-05-03 MED ORDER — DEXAMETHASONE SODIUM PHOSPHATE 10 MG/ML IJ SOLN
INTRAMUSCULAR | Status: DC | PRN
  Administered 2016-05-03: 10 mg via INTRAVENOUS

## 2016-05-03 MED ORDER — MIDAZOLAM HCL 2 MG/2ML IJ SOLN
INTRAMUSCULAR | Status: AC
  Filled 2016-05-03: qty 2

## 2016-05-03 SURGICAL SUPPLY — 81 items
BANDAGE ACE 4X5 VEL STRL LF (GAUZE/BANDAGES/DRESSINGS) IMPLANT
BANDAGE ACE 6X5 VEL STRL LF (GAUZE/BANDAGES/DRESSINGS) ×3 IMPLANT
BANDAGE ESMARK 6X9 LF (GAUZE/BANDAGES/DRESSINGS) ×1 IMPLANT
BLADE HEX COATED 2.75 (ELECTRODE) ×3 IMPLANT
BLADE SURG 15 STRL LF DISP TIS (BLADE) ×2 IMPLANT
BLADE SURG 15 STRL SS (BLADE) ×4
BNDG COHESIVE 4X5 TAN STRL (GAUZE/BANDAGES/DRESSINGS) ×3 IMPLANT
BNDG COHESIVE 6X5 TAN STRL LF (GAUZE/BANDAGES/DRESSINGS) ×3 IMPLANT
BNDG ESMARK 6X9 LF (GAUZE/BANDAGES/DRESSINGS) ×3
BRUSH SCRUB EZ PLAIN DRY (MISCELLANEOUS) ×3 IMPLANT
CANISTER SUCT 1200ML W/VALVE (MISCELLANEOUS) ×3 IMPLANT
COVER BACK TABLE 60X90IN (DRAPES) ×3 IMPLANT
COVER MAYO STAND STRL (DRAPES) ×3 IMPLANT
CUFF TOURNIQUET SINGLE 34IN LL (TOURNIQUET CUFF) ×3 IMPLANT
DECANTER SPIKE VIAL GLASS SM (MISCELLANEOUS) IMPLANT
DRAPE C-ARM 42X72 X-RAY (DRAPES) ×6 IMPLANT
DRAPE C-ARMOR (DRAPES) ×3 IMPLANT
DRAPE EXTREMITY T 121X128X90 (DRAPE) ×3 IMPLANT
DRAPE IMP U-DRAPE 54X76 (DRAPES) ×3 IMPLANT
DRAPE SURG 17X23 STRL (DRAPES) IMPLANT
DRILL 2.6X122MM WL AO SHAFT (BIT) ×3 IMPLANT
DRSG PAD ABDOMINAL 8X10 ST (GAUZE/BANDAGES/DRESSINGS) ×6 IMPLANT
DURAPREP 26ML APPLICATOR (WOUND CARE) ×3 IMPLANT
ELECT REM PT RETURN 9FT ADLT (ELECTROSURGICAL) ×3
ELECTRODE REM PT RTRN 9FT ADLT (ELECTROSURGICAL) ×1 IMPLANT
GAUZE SPONGE 4X4 12PLY STRL (GAUZE/BANDAGES/DRESSINGS) ×3 IMPLANT
GAUZE XEROFORM 1X8 LF (GAUZE/BANDAGES/DRESSINGS) ×3 IMPLANT
GLOVE BIO SURGEON STRL SZ 6.5 (GLOVE) ×4 IMPLANT
GLOVE BIO SURGEONS STRL SZ 6.5 (GLOVE) ×2
GLOVE BIOGEL PI IND STRL 7.0 (GLOVE) ×2 IMPLANT
GLOVE BIOGEL PI IND STRL 8 (GLOVE) ×1 IMPLANT
GLOVE BIOGEL PI INDICATOR 7.0 (GLOVE) ×4
GLOVE BIOGEL PI INDICATOR 8 (GLOVE) ×2
GLOVE SKINSENSE NS SZ7.5 (GLOVE) ×2
GLOVE SKINSENSE STRL SZ7.5 (GLOVE) ×1 IMPLANT
GLOVE SURG SYN 7.5  E (GLOVE) ×4
GLOVE SURG SYN 7.5 E (GLOVE) ×2 IMPLANT
GOWN STRL REIN XL XLG (GOWN DISPOSABLE) ×6 IMPLANT
GOWN STRL REUS W/ TWL LRG LVL3 (GOWN DISPOSABLE) ×1 IMPLANT
GOWN STRL REUS W/TWL LRG LVL3 (GOWN DISPOSABLE) ×2
K-WIRE ORTHOPEDIC 1.4X150L (WIRE) ×6
KWIRE ORTHOPEDIC 1.4X150L (WIRE) ×2 IMPLANT
NEEDLE HYPO 22GX1.5 SAFETY (NEEDLE) IMPLANT
NS IRRIG 1000ML POUR BTL (IV SOLUTION) ×9 IMPLANT
PACK BASIN DAY SURGERY FS (CUSTOM PROCEDURE TRAY) ×3 IMPLANT
PAD CAST 3X4 CTTN HI CHSV (CAST SUPPLIES) IMPLANT
PAD CAST 4YDX4 CTTN HI CHSV (CAST SUPPLIES) IMPLANT
PADDING CAST COTTON 3X4 STRL (CAST SUPPLIES)
PADDING CAST COTTON 4X4 STRL (CAST SUPPLIES)
PADDING CAST COTTON 6X4 STRL (CAST SUPPLIES) IMPLANT
PADDING CAST SYN 6 (CAST SUPPLIES) ×2
PADDING CAST SYNTHETIC 4 (CAST SUPPLIES) ×2
PADDING CAST SYNTHETIC 4X4 STR (CAST SUPPLIES) ×1 IMPLANT
PADDING CAST SYNTHETIC 6X4 NS (CAST SUPPLIES) ×1 IMPLANT
PENCIL BUTTON HOLSTER BLD 10FT (ELECTRODE) ×3 IMPLANT
PLATE FIBULA 4H (Plate) ×3 IMPLANT
SCREW BONE 3.5X16MM (Screw) ×6 IMPLANT
SCREW BONE NON-LCKING 3.5X12MM (Screw) ×9 IMPLANT
SCREW LOCK 3.5X14 (Screw) ×6 IMPLANT
SHEET MEDIUM DRAPE 40X70 STRL (DRAPES) ×3 IMPLANT
SLEEVE SCD COMPRESS KNEE MED (MISCELLANEOUS) ×3 IMPLANT
SPLINT FIBERGLASS 4X30 (CAST SUPPLIES) IMPLANT
SPONGE LAP 18X18 X RAY DECT (DISPOSABLE) ×3 IMPLANT
SUCTION FRAZIER HANDLE 10FR (MISCELLANEOUS) ×2
SUCTION TUBE FRAZIER 10FR DISP (MISCELLANEOUS) ×1 IMPLANT
SUT ETHILON 3 0 PS 1 (SUTURE) ×3 IMPLANT
SUT VIC AB 0 CT1 27 (SUTURE)
SUT VIC AB 0 CT1 27XBRD ANBCTR (SUTURE) IMPLANT
SUT VIC AB 2-0 CT1 27 (SUTURE) ×4
SUT VIC AB 2-0 CT1 TAPERPNT 27 (SUTURE) ×2 IMPLANT
SUT VIC AB 3-0 SH 27 (SUTURE)
SUT VIC AB 3-0 SH 27X BRD (SUTURE) IMPLANT
SYR BULB 3OZ (MISCELLANEOUS) ×3 IMPLANT
SYR CONTROL 10ML LL (SYRINGE) IMPLANT
TOWEL OR 17X24 6PK STRL BLUE (TOWEL DISPOSABLE) ×3 IMPLANT
TOWEL OR NON WOVEN STRL DISP B (DISPOSABLE) ×3 IMPLANT
TRAY DSU PREP LF (CUSTOM PROCEDURE TRAY) ×3 IMPLANT
TUBE CONNECTING 20'X1/4 (TUBING) ×1
TUBE CONNECTING 20X1/4 (TUBING) ×2 IMPLANT
UNDERPAD 30X30 (UNDERPADS AND DIAPERS) ×3 IMPLANT
YANKAUER SUCT BULB TIP NO VENT (SUCTIONS) ×3 IMPLANT

## 2016-05-03 NOTE — Anesthesia Preprocedure Evaluation (Addendum)
Anesthesia Evaluation  Patient identified by MRN, date of birth, ID band Patient awake    Reviewed: Allergy & Precautions, NPO status , Patient's Chart, lab work & pertinent test results  Airway Mallampati: III  TM Distance: >3 FB Neck ROM: Full    Dental  (+) Missing, Poor Dentition, Dental Advisory Given   Pulmonary neg pulmonary ROS,    breath sounds clear to auscultation       Cardiovascular negative cardio ROS   Rhythm:Regular Rate:Normal     Neuro/Psych PSYCHIATRIC DISORDERS Depression negative neurological ROS     GI/Hepatic negative GI ROS, Neg liver ROS,   Endo/Other  negative endocrine ROS  Renal/GU negative Renal ROS  negative genitourinary   Musculoskeletal negative musculoskeletal ROS (+)   Abdominal   Peds negative pediatric ROS (+)  Hematology negative hematology ROS (+)   Anesthesia Other Findings   Reproductive/Obstetrics negative OB ROS                            Anesthesia Physical Anesthesia Plan  ASA: II  Anesthesia Plan: General   Post-op Pain Management: GA combined w/ Regional for post-op pain   Induction: Intravenous  Airway Management Planned: LMA  Additional Equipment:   Intra-op Plan:   Post-operative Plan: Extubation in OR  Informed Consent: I have reviewed the patients History and Physical, chart, labs and discussed the procedure including the risks, benefits and alternatives for the proposed anesthesia with the patient or authorized representative who has indicated his/her understanding and acceptance.   Dental advisory given  Plan Discussed with: CRNA  Anesthesia Plan Comments:         Anesthesia Quick Evaluation

## 2016-05-03 NOTE — H&P (Signed)
    PREOPERATIVE H&P  Chief Complaint: left lateral malleolus fracture  HPI: Erica Cisneros is a 51 y.o. female who presents for surgical treatment of left lateral malleolus fracture.  She denies any changes in medical history.  Past Medical History:  Diagnosis Date  . Cough 05/01/2016  . Depression   . History of bronchitis   . Lateral malleolar fracture 04/22/2016   left  . Stuffy nose 05/01/2016   Past Surgical History:  Procedure Laterality Date  . TUBAL LIGATION     Social History   Social History  . Marital status: Legally Separated    Spouse name: N/A  . Number of children: N/A  . Years of education: N/A   Social History Main Topics  . Smoking status: Never Smoker  . Smokeless tobacco: Never Used  . Alcohol use Yes     Comment: socially  . Drug use: No  . Sexual activity: Yes    Birth control/ protection: Surgical   Other Topics Concern  . None   Social History Narrative  . None   History reviewed. No pertinent family history. Allergies  Allergen Reactions  . Penicillins Hives and Itching  . Soap Rash    DIAL SOAP   Prior to Admission medications   Medication Sig Start Date End Date Taking? Authorizing Provider  diclofenac (VOLTAREN) 75 MG EC tablet Take 1 tablet (75 mg total) by mouth 2 (two) times daily. 04/26/16  Yes Lenda KelpShane R Hudnall, MD  GARCINIA CAMBOGIA-CHROMIUM PO Apply topically.   Yes Historical Provider, MD  HYDROcodone-acetaminophen (NORCO/VICODIN) 5-325 MG tablet Take 1 tablet by mouth every 6 (six) hours as needed for moderate pain.   Yes Historical Provider, MD  methocarbamol (ROBAXIN) 500 MG tablet Take 1 tablet (500 mg total) by mouth every 8 (eight) hours as needed. 04/26/16  Yes Lenda KelpShane R Hudnall, MD  naproxen (NAPROSYN) 500 MG tablet Take 500 mg by mouth 2 (two) times daily with a meal.   Yes Historical Provider, MD  oxyCODONE-acetaminophen (PERCOCET) 7.5-325 MG tablet Take 1 tablet by mouth every 4 (four) hours as needed for severe pain.  04/26/16  Yes Lenda KelpShane R Hudnall, MD  Phenyleph-Doxylamine-DM-APAP (ALKA SELTZER PLUS PO) Take by mouth.   Yes Historical Provider, MD  PSEUDOEPHEDRINE-DM PO Take by mouth.   Yes Historical Provider, MD  sertraline (ZOLOFT) 100 MG tablet Take 100 mg by mouth daily.   Yes Historical Provider, MD     Positive ROS: All other systems have been reviewed and were otherwise negative with the exception of those mentioned in the HPI and as above.  Physical Exam: General: Alert, no acute distress Cardiovascular: No pedal edema Respiratory: No cyanosis, no use of accessory musculature GI: abdomen soft Skin: No lesions in the area of chief complaint Neurologic: Sensation intact distally Psychiatric: Patient is competent for consent with normal mood and affect Lymphatic: no lymphedema  MUSCULOSKELETAL: exam stable  Assessment: left lateral malleolus fracture  Plan: Plan for Procedure(s): OPEN REDUCTION INTERNAL FIXATION (ORIF) LEFT ANKLE FRACTURE  The risks benefits and alternatives were discussed with the patient including but not limited to the risks of nonoperative treatment, versus surgical intervention including infection, bleeding, nerve injury,  blood clots, cardiopulmonary complications, morbidity, mortality, among others, and they were willing to proceed.   Glee ArvinMichael Xu, MD   05/03/2016 8:17 AM

## 2016-05-03 NOTE — Anesthesia Postprocedure Evaluation (Signed)
Anesthesia Post Note  Patient: Dian QueenMachell Ikner  Procedure(s) Performed: Procedure(s) (LRB): OPEN REDUCTION INTERNAL FIXATION (ORIF) LEFT ANKLE FRACTURE (Left)  Patient location during evaluation: PACU Anesthesia Type: General Level of consciousness: awake and alert Pain management: pain level controlled Vital Signs Assessment: post-procedure vital signs reviewed and stable Respiratory status: spontaneous breathing, nonlabored ventilation, respiratory function stable and patient connected to nasal cannula oxygen Cardiovascular status: blood pressure returned to baseline and stable Postop Assessment: no signs of nausea or vomiting Anesthetic complications: no       Last Vitals:  Vitals:   05/03/16 1400 05/03/16 1415  BP: 121/81   Pulse: 66 64  Resp: (!) 26 11  Temp:      Last Pain:  Vitals:   05/03/16 1415  TempSrc:   PainSc: 0-No pain    LLE Motor Response: No movement due to regional block (05/03/16 1415) LLE Sensation: Decreased (Blocked) (05/03/16 1415)          Shelton SilvasKevin D Ulla Mckiernan

## 2016-05-03 NOTE — Anesthesia Procedure Notes (Signed)
Anesthesia Regional Block:  Adductor canal block  Pre-Anesthetic Checklist: ,, timeout performed, Correct Patient, Correct Site, Correct Laterality, Correct Procedure, Correct Position, site marked, Risks and benefits discussed,  Surgical consent,  Pre-op evaluation,  At surgeon's request and post-op pain management  Laterality: Left  Prep: chloraprep       Needles:  Injection technique: Single-shot  Needle Type: Echogenic Needle     Needle Length: 9cm 9 cm Needle Gauge: 21 and 21 G    Additional Needles:  Procedures: ultrasound guided (picture in chart) Adductor canal block Narrative:  Start time: 05/03/2016 12:15 PM End time: 05/03/2016 12:20 PM Injection made incrementally with aspirations every 5 mL.  Performed by: Personally  Anesthesiologist: Shona SimpsonHOLLIS, KEVIN D  Additional Notes: Discomfort with needle insertion through skin only.

## 2016-05-03 NOTE — Transfer of Care (Signed)
Immediate Anesthesia Transfer of Care Note  Patient: Dian QueenMachell Stemler  Procedure(s) Performed: Procedure(s): OPEN REDUCTION INTERNAL FIXATION (ORIF) LEFT ANKLE FRACTURE (Left)  Patient Location: PACU  Anesthesia Type:GA combined with regional for post-op pain  Level of Consciousness: awake and patient cooperative  Airway & Oxygen Therapy: Patient Spontanous Breathing and Patient connected to face mask oxygen  Post-op Assessment: Report given to RN and Post -op Vital signs reviewed and stable  Post vital signs: Reviewed and stable  Last Vitals:  Vitals:   05/03/16 1215 05/03/16 1220  BP: 97/71 97/71  Pulse: 65 67  Resp: 15 15  Temp:      Last Pain:  Vitals:   05/03/16 1135  TempSrc: Oral  PainSc: 5          Complications: No apparent anesthesia complications

## 2016-05-03 NOTE — Progress Notes (Signed)
AssistedDr. Hollis with left, ultrasound guided, popliteal/saphenous, adductor canal block. Side rails up, monitors on throughout procedure. See vital signs in flow sheet. Tolerated Procedure well. ° °

## 2016-05-03 NOTE — Discharge Instructions (Signed)
°  [facility] [phone]).   1. Keep splint clean and dry 2. Elevate foot above level of the heart 3. Take aspirin to prevent blood clots 4. Take pain meds as needed 5. Strict non weight bearing to operative extremity   Post Anesthesia Home Care Instructions  Activity: Get plenty of rest for the remainder of the day. A responsible adult should stay with you for 24 hours following the procedure.  For the next 24 hours, DO NOT: -Drive a car -Advertising copywriterperate machinery -Drink alcoholic beverages -Take any medication unless instructed by your physician -Make any legal decisions or sign important papers.  Meals: Start with liquid foods such as gelatin or soup. Progress to regular foods as tolerated. Avoid greasy, spicy, heavy foods. If nausea and/or vomiting occur, drink only clear liquids until the nausea and/or vomiting subsides. Call your physician if vomiting continues.  Special Instructions/Symptoms: Your throat may feel dry or sore from the anesthesia or the breathing tube placed in your throat during surgery. If this causes discomfort, gargle with warm salt water. The discomfort should disappear within 24 hours.  If you had a scopolamine patch placed behind your ear for the management of post- operative nausea and/or vomiting:  1. The medication in the patch is effective for 72 hours, after which it should be removed.  Wrap patch in a tissue and discard in the trash. Wash hands thoroughly with soap and water. 2. You may remove the patch earlier than 72 hours if you experience unpleasant side effects which may include dry mouth, dizziness or visual disturbances. 3. Avoid touching the patch. Wash your hands with soap and water after contact with the patch.   Regional Anesthesia Blocks  1. Numbness or the inability to move the "blocked" extremity may last from 3-48 hours after placement. The length of time depends on the medication injected and your individual response to the medication.  If the numbness is not going away after 48 hours, call your surgeon.  2. The extremity that is blocked will need to be protected until the numbness is gone and the  Strength has returned. Because you cannot feel it, you will need to take extra care to avoid injury. Because it may be weak, you may have difficulty moving it or using it. You may not know what position it is in without looking at it while the block is in effect.  3. For blocks in the legs and feet, returning to weight bearing and walking needs to be done carefully. You will need to wait until the numbness is entirely gone and the strength has returned. You should be able to move your leg and foot normally before you try and bear weight or walk. You will need someone to be with you when you first try to ensure you do not fall and possibly risk injury.  4. Bruising and tenderness at the needle site are common side effects and will resolve in a few days.  5. Persistent numbness or new problems with movement should be communicated to the surgeon or the [facility] [phone])/ [facility] [phone]).

## 2016-05-03 NOTE — Anesthesia Procedure Notes (Signed)
Anesthesia Regional Block:  Popliteal block  Pre-Anesthetic Checklist: ,, timeout performed, Correct Patient, Correct Site, Correct Laterality, Correct Procedure, Correct Position, site marked, Risks and benefits discussed,  Surgical consent,  Pre-op evaluation,  At surgeon's request and post-op pain management  Laterality: Left  Prep: chloraprep       Needles:  Injection technique: Single-shot  Needle Type: Echogenic Needle     Needle Length: 9cm 9 cm Needle Gauge: 21 and 21 G    Additional Needles:  Procedures: ultrasound guided (picture in chart) Popliteal block Narrative:  Start time: 05/03/2016 12:10 PM End time: 05/03/2016 12:15 PM Injection made incrementally with aspirations every 5 mL.  Performed by: Personally  Anesthesiologist: Shona SimpsonHOLLIS, Venna Berberich D  Additional Notes: Tolerated well.

## 2016-05-03 NOTE — Anesthesia Procedure Notes (Signed)
Procedure Name: LMA Insertion Date/Time: 05/03/2016 12:32 PM Performed by: Dayven Linsley D Pre-anesthesia Checklist: Patient identified, Emergency Drugs available, Suction available and Patient being monitored Patient Re-evaluated:Patient Re-evaluated prior to inductionOxygen Delivery Method: Circle system utilized Preoxygenation: Pre-oxygenation with 100% oxygen Intubation Type: IV induction Ventilation: Mask ventilation without difficulty LMA: LMA inserted LMA Size: 4.0 Number of attempts: 1 Airway Equipment and Method: Bite block Placement Confirmation: positive ETCO2 Tube secured with: Tape Dental Injury: Teeth and Oropharynx as per pre-operative assessment

## 2016-05-03 NOTE — Op Note (Signed)
   Date of Surgery: 05/03/2016  INDICATIONS: [name] is a 51 y.o.-year-old female who sustained a left ankle fracture; she was indicated for open reduction and internal fixation due to the displaced nature of the articular fracture and came to the operating room today for this procedure. The patient did consent to the procedure after discussion of the risks and benefits.  PREOPERATIVE DIAGNOSIS: left lateral malleolus ankle fracture  POSTOPERATIVE DIAGNOSIS: Same.  PROCEDURE: Open treatment of left ankle fracture with internal fixation. Lateral malleolar CPT [phone]  SURGEON: N. Glee ArvinMichael Xu, M.D.  ASSIST: Randa LynnEmily Williams, PA student.  ANESTHESIA:  general  TOURNIQUET TIME: Less than 60 minutes  IV FLUIDS AND URINE: See anesthesia.  ESTIMATED BLOOD LOSS: Minimal mL.  IMPLANTS: Stryker Variax 4 hole distal fibula plate  COMPLICATIONS: None.  DESCRIPTION OF PROCEDURE: The patient was brought to the operating room and placed supine on the operating table.  The patient had been signed prior to the procedure and this was documented. The patient had the anesthesia placed by the anesthesiologist.  A nonsterile tourniquet was placed on the upper thigh.  The prep verification and incision time-outs were performed to confirm that this was the correct patient, site, side and location. The patient had an SCD on the opposite lower extremity. The patient did receive antibiotics prior to the incision and was re-dosed during the procedure as needed at indicated intervals.  The patient had the lower extremity prepped and draped in the standard surgical fashion.  The extremity was exsanguinated using an esmarch bandage and the tourniquet was inflated to 300 mm Hg.  A lateral incision over the distal fibula was created. Dissection was carried down to the periosteum. Subperiosteal elevation was performed. The fracture was exposed. Organized hematoma and immature callus was removed. The fracture was then reduced  and clamped. This was confirmed under fluoroscopy. A lag screw was then placed using standard AO technique. The clamp was removed a precontoured plate was then placed on the lateral aspect of the fibula at the appropriate position using fluoroscopic guidance. Nonlocking locking screws were placed above and below the fracture in standard AO technique. A stress exam of the ankle showed no widening of the medial clear space. The wound was then thoroughly irrigated and closed in layer fashion using 2-0 Vicryl and 3-0 nylon. Sterile dressings were applied. Patient tolerated procedure well and no immediate competitions.  POSTOPERATIVE PLAN: [name] will remain nonweightbearing on this leg for approximately 6 weeks; [name] will return for suture removal in 2 weeks.  He will be immobilized in a short leg splint and then transitioned to a CAM walker at his first follow up appointment.  [name] will receive DVT prophylaxis based on other medications, activity level, and risk ratio of bleeding to thrombosis.  Mayra ReelN. Michael Xu, MD [facility] [phone] 1:35 PM

## 2016-05-04 ENCOUNTER — Encounter (HOSPITAL_BASED_OUTPATIENT_CLINIC_OR_DEPARTMENT_OTHER): Payer: Self-pay | Admitting: Orthopaedic Surgery

## 2016-05-05 ENCOUNTER — Telehealth (INDEPENDENT_AMBULATORY_CARE_PROVIDER_SITE_OTHER): Payer: Self-pay | Admitting: Orthopaedic Surgery

## 2016-05-05 NOTE — Telephone Encounter (Signed)
Patient called needing a RX of oxycodone for her pain. She's saying the 5mg  percocets aren't working. CB # [phone]

## 2016-05-08 ENCOUNTER — Ambulatory Visit: Payer: Managed Care, Other (non HMO) | Admitting: Family Medicine

## 2016-05-08 NOTE — Telephone Encounter (Signed)
Please advise 

## 2016-05-08 NOTE — Telephone Encounter (Signed)
Sure #30.

## 2016-05-08 NOTE — Telephone Encounter (Signed)
What mg do you want to give her since she states 5mg  don't work for her.?

## 2016-05-08 NOTE — Telephone Encounter (Signed)
5 mg. 1-2 tabs po q8h prn pain

## 2016-05-09 MED ORDER — OXYCODONE-ACETAMINOPHEN 5-325 MG PO TABS: ORAL_TABLET | ORAL | 0 refills | Status: DC

## 2016-05-09 NOTE — Telephone Encounter (Signed)
Rx ready for pick up at the front desk. Pt aware.  

## 2016-05-09 NOTE — Addendum Note (Signed)
Addended by: Albertina ParrGARCIA, Kachina Niederer on: 05/09/2016 10:01 AM   Modules accepted: Orders

## 2016-05-19 ENCOUNTER — Ambulatory Visit (INDEPENDENT_AMBULATORY_CARE_PROVIDER_SITE_OTHER): Payer: Self-pay | Admitting: Orthopaedic Surgery

## 2016-05-19 ENCOUNTER — Ambulatory Visit (INDEPENDENT_AMBULATORY_CARE_PROVIDER_SITE_OTHER): Payer: Managed Care, Other (non HMO)

## 2016-05-19 ENCOUNTER — Encounter (INDEPENDENT_AMBULATORY_CARE_PROVIDER_SITE_OTHER): Payer: Self-pay | Admitting: Orthopaedic Surgery

## 2016-05-19 ENCOUNTER — Inpatient Hospital Stay (INDEPENDENT_AMBULATORY_CARE_PROVIDER_SITE_OTHER): Payer: Managed Care, Other (non HMO) | Admitting: Orthopaedic Surgery

## 2016-05-19 DIAGNOSIS — S8262XD Displaced fracture of lateral malleolus of left fibula, subsequent encounter for closed fracture with routine healing: Secondary | ICD-10-CM | POA: Diagnosis not present

## 2016-05-19 MED ORDER — HYDROCODONE-ACETAMINOPHEN 5-325 MG PO TABS: 1.0000 | ORAL_TABLET | Freq: Every evening | ORAL | 0 refills | Status: DC | PRN

## 2016-05-19 MED ORDER — POLYETHYLENE GLYCOL 3350 17 G PO PACK: 17.0000 g | PACK | Freq: Every day | ORAL | 0 refills | Status: DC

## 2016-05-19 NOTE — Progress Notes (Signed)
Patient is 2 weeks status post operative fixation left ankle fracture. She is well. The incision is well-healed without signs of infection. Mild swelling. X-rays are stable. No signs of complications. Sutures were removed. Cam walker at all times. Continue nonweightbearing. Follow up in 4 weeks with repeat 3 view x-rays of the left ankle. Continue out of work for anticipated 3 months from the date of injury. I refilled her Norco and gave her prescription for MiraLAX for her constipation.

## 2016-06-14 ENCOUNTER — Other Ambulatory Visit (INDEPENDENT_AMBULATORY_CARE_PROVIDER_SITE_OTHER): Payer: Self-pay | Admitting: Orthopaedic Surgery

## 2016-06-14 NOTE — Telephone Encounter (Signed)
See message.

## 2016-06-15 ENCOUNTER — Encounter (INDEPENDENT_AMBULATORY_CARE_PROVIDER_SITE_OTHER): Payer: Self-pay | Admitting: Orthopaedic Surgery

## 2016-06-15 ENCOUNTER — Ambulatory Visit (INDEPENDENT_AMBULATORY_CARE_PROVIDER_SITE_OTHER): Payer: Self-pay | Admitting: Orthopaedic Surgery

## 2016-06-15 ENCOUNTER — Ambulatory Visit (INDEPENDENT_AMBULATORY_CARE_PROVIDER_SITE_OTHER): Payer: Managed Care, Other (non HMO)

## 2016-06-15 DIAGNOSIS — M25572 Pain in left ankle and joints of left foot: Secondary | ICD-10-CM | POA: Diagnosis not present

## 2016-06-15 DIAGNOSIS — S8262XD Displaced fracture of lateral malleolus of left fibula, subsequent encounter for closed fracture with routine healing: Secondary | ICD-10-CM

## 2016-06-15 NOTE — Progress Notes (Signed)
Patient is 6 weeks status post ORIF left lateral malleolus fracture. She is doing well. She's been ambulating on her own with a walking boot. Her scar is nicely healed without signs of infection. Minimal swelling. X-ray show stable fixation and healed fibula fracture. At this point begin physical therapy for strengthening and gait training. Wean crutches and Cam Walker. Follow-up in 6 weeks for final recheck. No x-rays needed unless she is having problems.

## 2016-06-28 ENCOUNTER — Telehealth (INDEPENDENT_AMBULATORY_CARE_PROVIDER_SITE_OTHER): Payer: Self-pay | Admitting: *Deleted

## 2016-06-28 NOTE — Telephone Encounter (Signed)
Pt called stating her back is hurting and cannot do her therapy, pt is asking for pain medication for this problem. Pt stated it was her low back that is hurting.

## 2016-06-29 MED ORDER — TRAMADOL HCL 50 MG PO TABS: 50.0000 mg | ORAL_TABLET | Freq: Two times a day (BID) | ORAL | 0 refills | Status: DC

## 2016-06-29 NOTE — Telephone Encounter (Signed)
Pt aware.

## 2016-06-29 NOTE — Telephone Encounter (Signed)
Tramadol #30

## 2016-06-29 NOTE — Telephone Encounter (Signed)
Please advise 

## 2016-06-29 NOTE — Telephone Encounter (Signed)
Called Rx into pharm

## 2016-07-27 ENCOUNTER — Ambulatory Visit (INDEPENDENT_AMBULATORY_CARE_PROVIDER_SITE_OTHER): Payer: Managed Care, Other (non HMO) | Admitting: Orthopaedic Surgery

## 2016-07-27 ENCOUNTER — Encounter (INDEPENDENT_AMBULATORY_CARE_PROVIDER_SITE_OTHER): Payer: Self-pay | Admitting: Orthopaedic Surgery

## 2016-07-27 DIAGNOSIS — S8262XD Displaced fracture of lateral malleolus of left fibula, subsequent encounter for closed fracture with routine healing: Secondary | ICD-10-CM | POA: Diagnosis not present

## 2016-07-27 MED ORDER — ACETAMINOPHEN-CODEINE #3 300-30 MG PO TABS: 1.0000 | ORAL_TABLET | Freq: Every day | ORAL | 0 refills | Status: DC | PRN

## 2016-07-27 NOTE — Progress Notes (Signed)
   Office Visit Note   Patient: Dian QueenMachell Rufus           Date of Birth: 1966-03-12           MRN: [id number] Visit Date: 07/27/2016              Requested by: Raynelle JanSpry, Heather M., MD [address] [place], [place] [id number] PCP: System, Pcp Not In   Assessment & Plan: Visit Diagnoses:  1. Closed displaced fracture of lateral malleolus of left fibula with routine healing, subsequent encounter     Plan: From ankle standpoint patient has reached MMI. She is doing well. She has 2 more sessions of physical therapy which she will finish out. For the back I did recommend physical therapy which she declined. I gave her prescription for Tylenol No. 3. Follow-up with me as needed. Continue to take diclofenac as needed.  Follow-Up Instructions: Return if symptoms worsen or fail to improve.   Orders:  No orders of the defined types were placed in this encounter.  Meds ordered this encounter  Medications  . acetaminophen-codeine (TYLENOL #3) 300-30 MG tablet    Sig: Take 1 tablet by mouth daily as needed for moderate pain.    Dispense:  30 tablet    Refill:  0      Procedures: No procedures performed   Clinical Data: No additional findings.   Subjective: Chief Complaint  Patient presents with  . Left Ankle - Follow-up    Patient is approximately 3 months status post ORIF left lateral malleolus fracture she is doing well she is mainly complaining of some back pain. Tramadol has not helped. 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    Review of Systems   Objective: Vital Signs: LMP 05/19/2014   Physical Exam  Ortho Exam Exam of the ankle in the back are stable. Ankle exam shows well-healed scar. No signs of infection. Nose swelling. Specialty Comments:  No specialty comments available.  Imaging: No results found.   PMFS History: Patient Active Problem List   Diagnosis Date Noted  . Closed displaced fracture of lateral malleolus of fibula with routine  healing   . Left ankle injury, initial encounter 04/27/2016  . Low back pain 04/27/2016   Past Medical History:  Diagnosis Date  . Cough 05/01/2016  . Depression   . History of bronchitis   . Lateral malleolar fracture 04/22/2016   left  . Stuffy nose 05/01/2016    No family history on file.  Past Surgical History:  Procedure Laterality Date  . ORIF ANKLE FRACTURE Left 05/03/2016   Procedure: OPEN REDUCTION INTERNAL FIXATION (ORIF) LEFT ANKLE FRACTURE;  Surgeon: Tarry KosNaiping M Lauralei Clouse, MD;  Location: [facility];  Service: Orthopedics;  Laterality: Left;  . TUBAL LIGATION     Social History   Occupational History  . Not on file.   Social History Main Topics  . Smoking status: Never Smoker  . Smokeless tobacco: Never Used  . Alcohol use Yes     Comment: socially  . Drug use: No  . Sexual activity: Yes    Birth control/ protection: Surgical

## 2017-02-20 ENCOUNTER — Other Ambulatory Visit: Payer: Self-pay

## 2017-02-20 ENCOUNTER — Emergency Department (HOSPITAL_BASED_OUTPATIENT_CLINIC_OR_DEPARTMENT_OTHER)
Admission: EM | Admit: 2017-02-20 | Discharge: 2017-02-20 | Disposition: A | Discharge status: Home / Self Care | Payer: 59 | Attending: Emergency Medicine | Admitting: Emergency Medicine

## 2017-02-20 ENCOUNTER — Encounter (HOSPITAL_BASED_OUTPATIENT_CLINIC_OR_DEPARTMENT_OTHER): Payer: Self-pay | Admitting: *Deleted

## 2017-02-20 DIAGNOSIS — Z79899 Other long term (current) drug therapy: Secondary | ICD-10-CM | POA: Insufficient documentation

## 2017-02-20 DIAGNOSIS — N3001 Acute cystitis with hematuria: Secondary | ICD-10-CM | POA: Diagnosis not present

## 2017-02-20 DIAGNOSIS — R300 Dysuria: Secondary | ICD-10-CM | POA: Diagnosis present

## 2017-02-20 LAB — URINALYSIS, ROUTINE W REFLEX MICROSCOPIC
Bilirubin Urine: NEGATIVE
GLUCOSE, UA: NEGATIVE mg/dL
Ketones, ur: NEGATIVE mg/dL
Nitrite: NEGATIVE
PROTEIN: NEGATIVE mg/dL
SPECIFIC GRAVITY, URINE: 1.02 (ref 1.005–1.030)
pH: 6 (ref 5.0–8.0)

## 2017-02-20 LAB — URINALYSIS, MICROSCOPIC (REFLEX)

## 2017-02-20 MED ORDER — FLUCONAZOLE 200 MG PO TABS: 200.0000 mg | ORAL_TABLET | ORAL | 0 refills | Status: AC

## 2017-02-20 MED ORDER — SULFAMETHOXAZOLE-TRIMETHOPRIM 800-160 MG PO TABS: 1.0000 | ORAL_TABLET | Freq: Two times a day (BID) | ORAL | 0 refills | Status: AC

## 2017-02-20 MED FILL — SULFAMETHOXAZOLE-TMP DS TAB: 800-160 | 7 days supply | Qty: 14 | Fill #0

## 2017-02-20 MED FILL — FLUCONAZOLE 200 MG TABLET: 200 | 6 days supply | Qty: 3 | Fill #0

## 2017-02-20 NOTE — ED Provider Notes (Signed)
[facility] EMERGENCY DEPARTMENT Provider Note   CSN: [id number] Arrival date & time: 02/20/17  1451     History   Chief Complaint Chief Complaint  Patient presents with  . Abdominal Pain  . Hematuria    HPI Erica Cisneros is a 51 y.o. female who presents for evaluation of 4 days of dysuria and hematuria.  Patient reports that initially when symptoms began, she experienced a "stinging" with urination.  Patient reports that then she noticed some bleeding.  Patient thought that the bleeding was coming from her vagina.  She states that she inserted a tampon but did not have any vaginal bleeding.  Patient reports that she is still had hematuria.  Patient reports that yesterday she had some suprapubic abdominal cramping but states that that has resolved. She currently denies any abdominal pain. Patient denies any fevers, nausea/vomiting, back pain.  The history is provided by the patient.    Past Medical History:  Diagnosis Date  . Cough 05/01/2016  . Depression   . History of bronchitis   . Lateral malleolar fracture 04/22/2016   left  . Stuffy nose 05/01/2016    Patient Active Problem List   Diagnosis Date Noted  . Closed displaced fracture of lateral malleolus of fibula with routine healing   . Left ankle injury, initial encounter 04/27/2016  . Low back pain 04/27/2016    Past Surgical History:  Procedure Laterality Date  . ORIF ANKLE FRACTURE Left 05/03/2016   Procedure: OPEN REDUCTION INTERNAL FIXATION (ORIF) LEFT ANKLE FRACTURE;  Surgeon: Tarry Kos, MD;  Location: [facility];  Service: Orthopedics;  Laterality: Left;  . TUBAL LIGATION      OB History    No data available       Home Medications    Prior to Admission medications   Medication Sig Start Date End Date Taking? Authorizing Provider  acetaminophen-codeine (TYLENOL #3) 300-30 MG tablet Take 1 tablet by mouth daily as needed for moderate pain. 07/27/16   Tarry Kos, MD    aspirin EC 325 MG tablet Take 1 tablet (325 mg total) by mouth 2 (two) times daily. Patient not taking: Reported on 07/27/2016 05/03/16   Tarry Kos, MD  diclofenac (VOLTAREN) 75 MG EC tablet Take 1 tablet (75 mg total) by mouth 2 (two) times daily. 04/26/16   Hudnall, Azucena Fallen, MD  fluconazole (DIFLUCAN) 200 MG tablet Take 1 tablet (200 mg total) by mouth every other day for 3 days. 02/20/17 02/23/17  Maxwell Caul, PA-C  GARCINIA CAMBOGIA-CHROMIUM PO Apply topically.    [provider]  HYDROcodone-acetaminophen (NORCO) 5-325 MG tablet Take 1-2 tablets by mouth at bedtime as needed. Patient not taking: Reported on 07/27/2016 05/19/16   Tarry Kos, MD  HYDROcodone-acetaminophen (NORCO/VICODIN) 5-325 MG tablet Take 1 tablet by mouth every 6 (six) hours as needed for moderate pain.    [provider]  methocarbamol (ROBAXIN) 500 MG tablet Take 1 tablet (500 mg total) by mouth every 8 (eight) hours as needed. 04/26/16   Hudnall, Azucena Fallen, MD  methocarbamol (ROBAXIN) 750 MG tablet Take 1 tablet (750 mg total) by mouth 2 (two) times daily as needed for muscle spasms. Patient not taking: Reported on 07/27/2016 05/03/16   Tarry Kos, MD  naproxen (NAPROSYN) 500 MG tablet Take 500 mg by mouth 2 (two) times daily with a meal.    [provider]  ondansetron (ZOFRAN) 4 MG tablet Take 1-2 tablets (4-8 mg total)  by mouth every 8 (eight) hours as needed for nausea or vomiting. Patient not taking: Reported on 07/27/2016 05/03/16   Tarry KosXu, Naiping M, MD  oxyCODONE (OXYCONTIN) 10 mg 12 hr tablet Take 1 tablet (10 mg total) by mouth every 12 (twelve) hours. Patient not taking: Reported on 07/27/2016 05/03/16   Tarry KosXu, Naiping M, MD  oxyCODONE-acetaminophen (PERCOCET) 5-325 MG tablet 1-2 tabs po q8h prn pain Patient not taking: Reported on 07/27/2016 05/09/16   Tarry KosXu, Naiping M, MD  oxyCODONE-acetaminophen (PERCOCET) 7.5-325 MG tablet Take 1 tablet by mouth every 4 (four) hours as needed for severe pain.  04/26/16   Hudnall, Azucena FallenShane R, MD  Phenyleph-Doxylamine-DM-APAP (ALKA SELTZER PLUS PO) Take by mouth.    [provider]  polyethylene glycol (MIRALAX / GLYCOLAX) packet MIX AND DRINK ONCE DAILY AS DIRECTED Patient not taking: Reported on 07/27/2016 06/14/16   Tarry KosXu, Naiping M, MD  PSEUDOEPHEDRINE-DM PO Take by mouth.    [provider]  senna-docusate (SENOKOT S) 8.6-50 MG tablet Take 1 tablet by mouth at bedtime as needed. Patient not taking: Reported on 07/27/2016 05/03/16   Tarry KosXu, Naiping M, MD  sertraline (ZOLOFT) 100 MG tablet Take 100 mg by mouth daily.    [provider]  sulfamethoxazole-trimethoprim (BACTRIM DS,SEPTRA DS) 800-160 MG tablet Take 1 tablet by mouth 2 (two) times daily for 7 days. 02/20/17 02/27/17  Maxwell CaulLayden, Lindsey A, PA-C  traMADol (ULTRAM) 50 MG tablet Take 1 tablet (50 mg total) by mouth 2 (two) times daily. Patient not taking: Reported on 07/27/2016 06/29/16   Tarry KosXu, Naiping M, MD    Family History No family history on file.  Social History Social History   Tobacco Use  . Smoking status: Never Smoker  . Smokeless tobacco: Never Used  Substance Use Topics  . Alcohol use: Yes    Comment: socially  . Drug use: No     Allergies   Penicillins and Soap   Review of Systems Review of Systems  Constitutional: Negative for fever.  Cardiovascular: Negative for chest pain.  Gastrointestinal: Negative for abdominal pain, nausea and vomiting.  Genitourinary: Positive for dysuria and hematuria. Negative for flank pain and pelvic pain.     Physical Exam Updated Vital Signs BP 112/63   Pulse 66   Temp 97.8 F (36.6 C) (Oral)   Resp 18   Ht 5\' 3"  (1.6 m)   Wt 86.2 kg (190 lb)   LMP 05/19/2014   SpO2 100%   BMI 33.66 kg/m   Physical Exam  Constitutional: She appears well-developed and well-nourished.  Sitting comfortably on examination table  HENT:  Head: Normocephalic and atraumatic.  Eyes: Conjunctivae and EOM are normal. Right eye  exhibits no discharge. Left eye exhibits no discharge. No scleral icterus.  Pulmonary/Chest: Effort normal.  Abdominal: Soft. Normal appearance. She exhibits no distension. There is no tenderness. There is no rigidity, no guarding and no CVA tenderness.  Abdomen is soft, nondistended, nontender.  Patient is pleased no CVA tenderness.  Neurological: She is alert.  Skin: Skin is warm and dry.  Psychiatric: She has a normal mood and affect. Her speech is normal and behavior is normal.  Nursing note and vitals reviewed.    ED Treatments / Results  Labs (all labs ordered are listed, but only abnormal results are displayed) Labs Reviewed  URINALYSIS, ROUTINE W REFLEX MICROSCOPIC - Abnormal; Notable for the following components:      Result Value   Hgb urine dipstick TRACE (*)    Leukocytes, UA  TRACE (*)    All other components within normal limits  URINALYSIS, MICROSCOPIC (REFLEX) - Abnormal; Notable for the following components:   Bacteria, UA FEW (*)    Squamous Epithelial / LPF 0-5 (*)    All other components within normal limits    EKG  EKG Interpretation None       Radiology No results found.  Procedures Procedures (including critical care time)  Medications Ordered in ED Medications - No data to display   Initial Impression / Assessment and Plan / ED Course  I have reviewed the triage vital signs and the nursing notes.  Pertinent labs & imaging results that were available during my care of the patient were reviewed by me and considered in my medical decision making (see chart for details).     51 year old female who presents with evaluation for hematuria and dysuria times 4 days.  Patient reports initially she had some suprapubic abdominal cramping but states that has resolved.  Currently denies any abdominal pain.  No fevers, nausea/vomiting.  No history of flank or pelvic pain. Patient is afebrile, non-toxic appearing, sitting comfortably on examination table.  Vital signs reviewed and stable.  Patient is sitting comfortably on examination table.  Physical exam shows no evidence of CVA tenderness.  No abdominal tenderness noted on exam.  Patient denies any abdominal pain.  Symptoms appear consistent with UTI.  Suspect the patient was having bladder spasms that was causing the discomfort.  History/physical exam is not concerning for pyelonephritis or kidney stone.  UA ordered at triage.  UA positive for trace hemoglobin, trace leukocytes, pyuria.  We will plan to treat as UTI.  Patient allergic to penicillins.  Will give appropriate antibiotic therapy.  Patient also given Diflucan for preventative yeast infection. Patient had ample opportunity for questions and discussion. All patient's questions were answered with full understanding. Strict return precautions discussed. Patient expresses understanding and agreement to plan.    Final Clinical Impressions(s) / ED Diagnoses   Final diagnoses:  Acute cystitis with hematuria    ED Discharge Orders        Ordered    sulfamethoxazole-trimethoprim (BACTRIM DS,SEPTRA DS) 800-160 MG tablet  2 times daily     02/20/17 1616    fluconazole (DIFLUCAN) 200 MG tablet  Every other day     02/20/17 1617       Maxwell CaulLayden, Lindsey A, PA-C 02/20/17 1633    Melene PlanFloyd, Dan, DO 02/21/17 [phone]

## 2017-02-20 NOTE — Discharge Instructions (Addendum)
Take antibiotics as directed. Please take all of your antibiotics until finished.  Take 1 Diflucan pill every other day for 3 days.  Make sure you are drinking plenty of fluids and staying hydrated.  Return the emergency department for any fever, worsening abdominal pain, nausea/vomiting, back pain, vaginal bleeding or any other worsening or concerning symptoms.

## 2017-02-20 NOTE — ED Triage Notes (Signed)
Lower abdominal pain and hematuria x 4 days.

## 2018-02-12 ENCOUNTER — Encounter (HOSPITAL_BASED_OUTPATIENT_CLINIC_OR_DEPARTMENT_OTHER): Payer: Self-pay | Admitting: Emergency Medicine

## 2018-02-12 ENCOUNTER — Other Ambulatory Visit: Payer: Self-pay

## 2018-02-12 ENCOUNTER — Emergency Department (HOSPITAL_BASED_OUTPATIENT_CLINIC_OR_DEPARTMENT_OTHER): Payer: Self-pay

## 2018-02-12 ENCOUNTER — Emergency Department (HOSPITAL_BASED_OUTPATIENT_CLINIC_OR_DEPARTMENT_OTHER)
Admission: EM | Admit: 2018-02-12 | Discharge: 2018-02-12 | Disposition: A | Discharge status: Home / Self Care | Payer: Self-pay | Attending: Emergency Medicine | Admitting: Emergency Medicine

## 2018-02-12 DIAGNOSIS — Y92410 Unspecified street and highway as the place of occurrence of the external cause: Secondary | ICD-10-CM | POA: Insufficient documentation

## 2018-02-12 DIAGNOSIS — M545 Low back pain, unspecified: Secondary | ICD-10-CM

## 2018-02-12 DIAGNOSIS — Z79899 Other long term (current) drug therapy: Secondary | ICD-10-CM | POA: Insufficient documentation

## 2018-02-12 DIAGNOSIS — Y998 Other external cause status: Secondary | ICD-10-CM | POA: Insufficient documentation

## 2018-02-12 DIAGNOSIS — G8929 Other chronic pain: Secondary | ICD-10-CM | POA: Insufficient documentation

## 2018-02-12 DIAGNOSIS — M25572 Pain in left ankle and joints of left foot: Secondary | ICD-10-CM | POA: Insufficient documentation

## 2018-02-12 DIAGNOSIS — Y93I9 Activity, other involving external motion: Secondary | ICD-10-CM | POA: Insufficient documentation

## 2018-02-12 MED ORDER — NAPROXEN 250 MG PO TABS
500.0000 mg | ORAL_TABLET | Freq: Once | ORAL | Status: AC
  Administered 2018-02-12: 500 mg via ORAL
  Filled 2018-02-12: qty 2

## 2018-02-12 MED ORDER — METHOCARBAMOL 500 MG PO TABS
500.0000 mg | ORAL_TABLET | Freq: Two times a day (BID) | ORAL | 0 refills | Status: DC
Start: 2018-02-12 — End: 2020-09-23

## 2018-02-12 NOTE — Discharge Instructions (Signed)
Take NSAIDs or Tylenol as needed for the next week. Take this medicine with food. °Take muscle relaxer at bedtime to help you sleep. This medicine makes you drowsy so do not take before driving or work °Use a heating pad for sore muscles - use for 20 minutes several times a day °Return for worsening symptoms ° °
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
## 2018-02-12 NOTE — ED Notes (Signed)
Patient transported to X-ray 

## 2018-02-12 NOTE — ED Notes (Signed)
ED Provider at bedside. 
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
## 2020-09-23 ENCOUNTER — Encounter (HOSPITAL_BASED_OUTPATIENT_CLINIC_OR_DEPARTMENT_OTHER): Payer: Self-pay | Admitting: *Deleted

## 2020-09-23 ENCOUNTER — Other Ambulatory Visit: Payer: Self-pay

## 2020-09-23 ENCOUNTER — Emergency Department (HOSPITAL_BASED_OUTPATIENT_CLINIC_OR_DEPARTMENT_OTHER)
Admission: EM | Admit: 2020-09-23 | Discharge: 2020-09-24 | Disposition: A | Discharge status: Home / Self Care | Payer: Managed Care, Other (non HMO) | Attending: Emergency Medicine | Admitting: Emergency Medicine

## 2020-09-23 DIAGNOSIS — M79671 Pain in right foot: Secondary | ICD-10-CM | POA: Diagnosis present

## 2020-09-23 DIAGNOSIS — M79672 Pain in left foot: Secondary | ICD-10-CM | POA: Insufficient documentation

## 2020-09-23 LAB — URINALYSIS, ROUTINE W REFLEX MICROSCOPIC
Bilirubin Urine: NEGATIVE
Glucose, UA: NEGATIVE mg/dL
Hgb urine dipstick: NEGATIVE
Ketones, ur: NEGATIVE mg/dL
Leukocytes,Ua: NEGATIVE
Nitrite: NEGATIVE
Protein, ur: NEGATIVE mg/dL
Specific Gravity, Urine: 1.02 (ref 1.005–1.030)
pH: 6 (ref 5.0–8.0)

## 2020-09-23 LAB — CBC WITH DIFFERENTIAL/PLATELET
Abs Immature Granulocytes: 0.01 10*3/uL (ref 0.00–0.07)
Basophils Absolute: 0 10*3/uL (ref 0.0–0.1)
Basophils Relative: 1 %
Eosinophils Absolute: 0.3 10*3/uL (ref 0.0–0.5)
Eosinophils Relative: 6 %
HCT: 38.1 % (ref 36.0–46.0)
Hemoglobin: 12.1 g/dL (ref 12.0–15.0)
Immature Granulocytes: 0 %
Lymphocytes Relative: 44 %
Lymphs Abs: 2.3 10*3/uL (ref 0.7–4.0)
MCH: 28.4 pg (ref 26.0–34.0)
MCHC: 31.8 g/dL (ref 30.0–36.0)
MCV: 89.4 fL (ref 80.0–100.0)
Monocytes Absolute: 0.3 10*3/uL (ref 0.1–1.0)
Monocytes Relative: 7 %
Neutro Abs: 2.1 10*3/uL (ref 1.7–7.7)
Neutrophils Relative %: 42 %
Platelets: 218 10*3/uL (ref 150–400)
RBC: 4.26 MIL/uL (ref 3.87–5.11)
RDW: 15.5 % (ref 11.5–15.5)
WBC: 5 10*3/uL (ref 4.0–10.5)
nRBC: 0 % (ref 0.0–0.2)

## 2020-09-23 LAB — COMPREHENSIVE METABOLIC PANEL
ALT: 19 U/L (ref 0–44)
AST: 26 U/L (ref 15–41)
Albumin: 3.8 g/dL (ref 3.5–5.0)
Alkaline Phosphatase: 82 U/L (ref 38–126)
Anion gap: 5 (ref 5–15)
BUN: 15 mg/dL (ref 6–20)
CO2: 29 mmol/L (ref 22–32)
Calcium: 8.7 mg/dL — ABNORMAL LOW (ref 8.9–10.3)
Chloride: 103 mmol/L (ref 98–111)
Creatinine, Ser: 0.84 mg/dL (ref 0.44–1.00)
GFR, Estimated: >60 mL/min (ref >60)
Glucose, Bld: 107 mg/dL — ABNORMAL HIGH (ref 70–99)
Potassium: 3.6 mmol/L (ref 3.5–5.1)
Sodium: 137 mmol/L (ref 135–145)
Total Bilirubin: 0.3 mg/dL (ref 0.3–1.2)
Total Protein: 8.2 g/dL — ABNORMAL HIGH (ref 6.5–8.1)

## 2020-09-23 MED ORDER — NAPROXEN 500 MG PO TABS: ORAL_TABLET | ORAL | 0 refills | Status: DC

## 2020-09-23 MED ORDER — NAPROXEN 250 MG PO TABS
500.0000 mg | ORAL_TABLET | Freq: Once | ORAL | Status: AC
  Administered 2020-09-23: 500 mg via ORAL
  Filled 2020-09-23: qty 2

## 2020-09-23 NOTE — ED Triage Notes (Signed)
Pain in her legs x 2 weeks. She noticed broken veins in her legs prior to the pain. She has been taking Aleve and Tylenol. She is ambulatory.

## 2020-09-23 NOTE — ED Provider Notes (Signed)
[facility] Provider Note: Erica Dell, MD, FACEP  CSN: [id number] MRN: [id number] ARRIVAL: 09/23/20 at 1929 ROOM: [facility]12/[facility]12   CHIEF COMPLAINT  Leg Pain   HISTORY OF PRESENT ILLNESS  09/23/20 11:40 PM Erica Cisneros is a 55 y.o. female with about 2 weeks of pain in her feet.  Specifically she has pain in her heels, primarily on the bottom.  Pain is worse with flexion and extension at the ankle as well as with weightbearing.  She has been taking Tylenol and Aleve without adequate relief.  She has not injured her feet recently.    Past Medical History:  Diagnosis Date   Cough 05/01/2016   Depression    History of bronchitis    Lateral malleolar fracture 04/22/2016   left   Stuffy nose 05/01/2016    Past Surgical History:  Procedure Laterality Date   ORIF ANKLE FRACTURE Left 05/03/2016   Procedure: OPEN REDUCTION INTERNAL FIXATION (ORIF) LEFT ANKLE FRACTURE;  Surgeon: Tarry Kos, MD;  Location: [facility];  Service: Orthopedics;  Laterality: Left;   TUBAL LIGATION      No family history on file.  Social History   Tobacco Use   Smoking status: Never   Smokeless tobacco: Never  Vaping Use   Vaping Use: Never used  Substance Use Topics   Alcohol use: Yes    Comment: socially   Drug use: No    Prior to Admission medications   Medication Sig Start Date End Date Taking? Authorizing Provider  naproxen (NAPROSYN) 500 MG tablet Take 1 tablet twice daily as needed for pain. 09/23/20  Yes Gabryelle Whitmoyer, MD  sertraline (ZOLOFT) 100 MG tablet Take 100 mg by mouth daily.    [provider]    Allergies Penicillins and Soap   REVIEW OF SYSTEMS  Negative except as noted here or in the History of Present Illness.   PHYSICAL EXAMINATION  Initial Vital Signs Blood pressure (!) 159/85, pulse 62, temperature 98.2 F (36.8 C), temperature source Oral, resp. rate 18, height 5\' 3"  (1.6 m), weight 88.5 kg, last menstrual period 05/19/2014,  SpO2 100 %.  Examination General: Well-developed, well-nourished female in no acute distress; appearance consistent with age of record HENT: normocephalic; atraumatic Eyes: pupils equal, round and reactive to light; extraocular muscles intact Neck: supple Heart: regular rate and rhythm Lungs: clear to auscultation bilaterally Abdomen: soft; nondistended; nontender; bowel sounds present Extremities: No deformity; full range of motion; tenderness of heels bilaterally without tenderness of Achilles tendons; no edema of lower legs; no calf tenderness Neurologic: Awake, alert and oriented; motor function intact in all extremities and symmetric; no facial droop Skin: Warm and dry Psychiatric: Normal mood and affect   RESULTS  Summary of this visit's results, reviewed and interpreted by myself:   EKG Interpretation  Date/Time:    Ventricular Rate:    PR Interval:    QRS Duration:   QT Interval:    QTC Calculation:   R Axis:     Text Interpretation:          Laboratory Studies: Results for orders placed or performed during the hospital encounter of 09/23/20 (from the past 24 hour(s))  CBC with Differential     Status: None   Collection Time: 09/23/20  7:53 PM  Result Value Ref Range   WBC 5.0 4.0 - 10.5 K/uL   RBC 4.26 3.87 - 5.11 MIL/uL   Hemoglobin 12.1 12.0 - 15.0 g/dL   HCT 11/24/20 10.2 -  46.0 %   MCV 89.4 80.0 - 100.0 fL   MCH 28.4 26.0 - 34.0 pg   MCHC 31.8 30.0 - 36.0 g/dL   RDW 78.5 88.5 - 02.7 %   Platelets 218 150 - 400 K/uL   nRBC 0.0 0.0 - 0.2 %   Neutrophils Relative % 42 %   Neutro Abs 2.1 1.7 - 7.7 K/uL   Lymphocytes Relative 44 %   Lymphs Abs 2.3 0.7 - 4.0 K/uL   Monocytes Relative 7 %   Monocytes Absolute 0.3 0.1 - 1.0 K/uL   Eosinophils Relative 6 %   Eosinophils Absolute 0.3 0.0 - 0.5 K/uL   Basophils Relative 1 %   Basophils Absolute 0.0 0.0 - 0.1 K/uL   Immature Granulocytes 0 %   Abs Immature Granulocytes 0.01 0.00 - 0.07 K/uL  Comprehensive  metabolic panel     Status: Abnormal   Collection Time: 09/23/20  7:53 PM  Result Value Ref Range   Sodium 137 135 - 145 mmol/L   Potassium 3.6 3.5 - 5.1 mmol/L   Chloride 103 98 - 111 mmol/L   CO2 29 22 - 32 mmol/L   Glucose, Bld 107 (H) 70 - 99 mg/dL   BUN 15 6 - 20 mg/dL   Creatinine, Ser 7.41 0.44 - 1.00 mg/dL   Calcium 8.7 (L) 8.9 - 10.3 mg/dL   Total Protein 8.2 (H) 6.5 - 8.1 g/dL   Albumin 3.8 3.5 - 5.0 g/dL   AST 26 15 - 41 U/L   ALT 19 0 - 44 U/L   Alkaline Phosphatase 82 38 - 126 U/L   Total Bilirubin 0.3 0.3 - 1.2 mg/dL   GFR, Estimated >28 >78 mL/min   Anion gap 5 5 - 15  Urinalysis, Routine w reflex microscopic Urine, Clean Catch     Status: None   Collection Time: 09/23/20  7:53 PM  Result Value Ref Range   Color, Urine YELLOW YELLOW   APPearance CLEAR CLEAR   Specific Gravity, Urine 1.020 1.005 - 1.030   pH 6.0 5.0 - 8.0   Glucose, UA NEGATIVE NEGATIVE mg/dL   Hgb urine dipstick NEGATIVE NEGATIVE   Bilirubin Urine NEGATIVE NEGATIVE   Ketones, ur NEGATIVE NEGATIVE mg/dL   Protein, ur NEGATIVE NEGATIVE mg/dL   Nitrite NEGATIVE NEGATIVE   Leukocytes,Ua NEGATIVE NEGATIVE   Imaging Studies: No results found.  ED COURSE and MDM  Nursing notes, initial and subsequent vitals signs, including pulse oximetry, reviewed and interpreted by myself.  Vitals:   09/23/20 1938 09/23/20 1943 09/23/20 2216  BP:  (!) 141/82 (!) 159/85  Pulse:  70 62  Resp:  18 18  Temp:  98.2 F (36.8 C)   TempSrc:  Oral   SpO2:  95% 100%  Weight: 88.5 kg    Height: 5\' 3"  (1.6 m)     Medications  naproxen (NAPROSYN) tablet 500 mg (has no administration in time range)    The patient's presentation is consistent with musculoskeletal inflammatory pain.  We will place her on prescription strength naproxen and refer to podiatry.  The location of her pain is not consistent with DVTs.  PROCEDURES  Procedures   ED DIAGNOSES     ICD-10-CM   1. Heel pain, bilateral  M79.671              M76.720, MD 09/23/20 2353

## 2021-05-26 ENCOUNTER — Encounter (HOSPITAL_BASED_OUTPATIENT_CLINIC_OR_DEPARTMENT_OTHER): Payer: Self-pay

## 2021-05-26 ENCOUNTER — Emergency Department (HOSPITAL_BASED_OUTPATIENT_CLINIC_OR_DEPARTMENT_OTHER)
Admission: EM | Admit: 2021-05-26 | Discharge: 2021-05-26 | Disposition: A | Discharge status: Home / Self Care | Payer: Managed Care, Other (non HMO) | Attending: Emergency Medicine | Admitting: Emergency Medicine

## 2021-05-26 ENCOUNTER — Other Ambulatory Visit: Payer: Self-pay

## 2021-05-26 DIAGNOSIS — R102 Pelvic and perineal pain: Secondary | ICD-10-CM | POA: Diagnosis not present

## 2021-05-26 LAB — COMPREHENSIVE METABOLIC PANEL
ALT: 21 U/L (ref 0–44)
AST: 26 U/L (ref 15–41)
Albumin: 3.6 g/dL (ref 3.5–5.0)
Alkaline Phosphatase: 83 U/L (ref 38–126)
Anion gap: 8 (ref 5–15)
BUN: 17 mg/dL (ref 6–20)
CO2: 26 mmol/L (ref 22–32)
Calcium: 8.8 mg/dL — ABNORMAL LOW (ref 8.9–10.3)
Chloride: 103 mmol/L (ref 98–111)
Creatinine, Ser: 0.65 mg/dL (ref 0.44–1.00)
GFR, Estimated: >60 mL/min (ref >60)
Glucose, Bld: 101 mg/dL — ABNORMAL HIGH (ref 70–99)
Potassium: 3.6 mmol/L (ref 3.5–5.1)
Sodium: 137 mmol/L (ref 135–145)
Total Bilirubin: 0.4 mg/dL (ref 0.3–1.2)
Total Protein: 8.1 g/dL (ref 6.5–8.1)

## 2021-05-26 LAB — CBC WITH DIFFERENTIAL/PLATELET
Abs Immature Granulocytes: 0.01 10*3/uL (ref 0.00–0.07)
Basophils Absolute: 0 10*3/uL (ref 0.0–0.1)
Basophils Relative: 1 %
Eosinophils Absolute: 0.5 10*3/uL (ref 0.0–0.5)
Eosinophils Relative: 8 %
HCT: 36.9 % (ref 36.0–46.0)
Hemoglobin: 12.1 g/dL (ref 12.0–15.0)
Immature Granulocytes: 0 %
Lymphocytes Relative: 32 %
Lymphs Abs: 1.8 10*3/uL (ref 0.7–4.0)
MCH: 29 pg (ref 26.0–34.0)
MCHC: 32.8 g/dL (ref 30.0–36.0)
MCV: 88.5 fL (ref 80.0–100.0)
Monocytes Absolute: 0.5 10*3/uL (ref 0.1–1.0)
Monocytes Relative: 9 %
Neutro Abs: 2.9 10*3/uL (ref 1.7–7.7)
Neutrophils Relative %: 50 %
Platelets: 210 10*3/uL (ref 150–400)
RBC: 4.17 MIL/uL (ref 3.87–5.11)
RDW: 14.6 % (ref 11.5–15.5)
WBC: 5.8 10*3/uL (ref 4.0–10.5)
nRBC: 0 % (ref 0.0–0.2)

## 2021-05-26 LAB — WET PREP, GENITAL
Sperm: NONE SEEN
Trich, Wet Prep: NONE SEEN
WBC, Wet Prep HPF POC: >=10 — AB (ref <10)
Yeast Wet Prep HPF POC: NONE SEEN

## 2021-05-26 LAB — HIV ANTIBODY (ROUTINE TESTING W REFLEX): HIV Screen 4th Generation wRfx: NONREACTIVE

## 2021-05-26 LAB — URINALYSIS, ROUTINE W REFLEX MICROSCOPIC
Bilirubin Urine: NEGATIVE
Glucose, UA: NEGATIVE mg/dL
Hgb urine dipstick: NEGATIVE
Ketones, ur: NEGATIVE mg/dL
Leukocytes,Ua: NEGATIVE
Nitrite: NEGATIVE
Protein, ur: NEGATIVE mg/dL
Specific Gravity, Urine: >=1.03 (ref 1.005–1.030)
pH: 5 (ref 5.0–8.0)

## 2021-05-26 LAB — LIPASE, BLOOD: Lipase: 35 U/L (ref 11–51)

## 2021-05-26 MED ORDER — MUPIROCIN CALCIUM 2 % EX CREA
TOPICAL_CREAM | Freq: Once | CUTANEOUS | Status: DC
Start: 2021-05-26 — End: 2021-05-26

## 2021-05-26 MED ORDER — DOXYCYCLINE HYCLATE 100 MG PO CAPS: 100.0000 mg | ORAL_CAPSULE | Freq: Two times a day (BID) | ORAL | 0 refills | Status: DC

## 2021-05-26 MED ORDER — CEFTRIAXONE SODIUM 1 G IJ SOLR
1000.0000 mg | Freq: Once | INTRAMUSCULAR | Status: AC
  Administered 2021-05-26: 21:00:00 1000 mg via INTRAVENOUS
  Filled 2021-05-26: qty 10

## 2021-05-26 MED ORDER — MUPIROCIN 2 % EX OINT
TOPICAL_OINTMENT | Freq: Two times a day (BID) | CUTANEOUS | Status: DC
  Filled 2021-05-26: qty 22

## 2021-05-26 MED ORDER — DOXYCYCLINE HYCLATE 100 MG PO TABS
100.0000 mg | ORAL_TABLET | Freq: Once | ORAL | Status: AC
  Administered 2021-05-26: 21:00:00 100 mg via ORAL
  Filled 2021-05-26: qty 1

## 2021-05-26 MED ORDER — PHENTERMINE HCL 37.5 MG PO CAPS: 37.5000 mg | ORAL_CAPSULE | ORAL | 0 refills | Status: AC

## 2021-05-26 MED ORDER — FLUCONAZOLE 200 MG PO TABS: 200.0000 mg | ORAL_TABLET | Freq: Every day | ORAL | 0 refills | Status: AC

## 2021-05-26 NOTE — ED Triage Notes (Signed)
Pt c/o lower abd pain, vaginal itching x 2-3 weeks-denies vaginal d/c-requesting STD testing-NAD-steady gait ?

## 2021-05-26 NOTE — ED Notes (Signed)
Patient discharged to home.  All discharge instructions reviewed.  Patient verbalized understanding via teachback method.  VS WDL.  Respirations even and unlabored.  Ambulatory out of ED.   °

## 2021-05-26 NOTE — ED Provider Notes (Signed)
?[facility] EMERGENCY DEPARTMENT ?Provider Note ? ? ?CSN: [id number] ?Arrival date & time: 05/26/21  1655 ? ?  ? ?History ? ?Chief Complaint  ?Patient presents with  ? Pelvic Pain  ? ? ?Erica Cisneros is a 56 y.o. female. ? ?Patient is a 56 year old female with a history of depression and prior tubal ligation who is presenting today with 2-week history of gradually progressive vaginal burning, itching and suprapubic pelvic pain.  She denies any fever, nausea, vomiting, bowel changes.  She is currently sexually active with 1 partner since November but reports that he is married and he does have multiple sexual partners.  She does not use protection regularly.  She has not noted any significant vaginal discharge and denies any vaginal bleeding. ? ?The history is provided by the patient.  ?Pelvic Pain ? ? ?  ? ?Home Medications ?Prior to Admission medications   ?Medication Sig Start Date End Date Taking? Authorizing Provider  ?doxycycline (VIBRAMYCIN) 100 MG capsule Take 1 capsule (100 mg total) by mouth 2 (two) times daily. 05/26/21  Yes Gwyneth Sprout, MD  ?naproxen (NAPROSYN) 500 MG tablet Take 1 tablet twice daily as needed for pain. 09/23/20   Molpus, John, MD  ?phentermine 37.5 MG capsule Take 1 capsule (37.5 mg total) by mouth every morning. 05/26/21  Yes Gwyneth Sprout, MD  ?sertraline (ZOLOFT) 100 MG tablet Take 100 mg by mouth daily.    [provider]  ?   ? ?Allergies    ?Penicillins and Soap   ? ?Review of Systems   ?Review of Systems  ?Genitourinary:  Positive for pelvic pain.  ? ?Physical Exam ?Updated Vital Signs ?BP (!) 152/73 (BP Location: Left Arm)   Pulse 69   Temp 97.8 ?F (36.6 ?C) (Oral)   Resp 16   Ht 5\' 3"  (1.6 m)   Wt 86.2 kg   LMP 05/19/2014   SpO2 100%   BMI 33.66 kg/m?  ?Physical Exam ?Vitals and nursing note reviewed. Exam conducted with a chaperone present.  ?Constitutional:   ?   General: She is in acute distress.  ?   Appearance: She is well-developed.  ?HENT:  ?    Head: Normocephalic and atraumatic.  ?Eyes:  ?   Pupils: Pupils are equal, round, and reactive to light.  ?Cardiovascular:  ?   Rate and Rhythm: Normal rate and regular rhythm.  ?   Heart sounds: Normal heart sounds. No murmur heard. ?  No friction rub.  ?Pulmonary:  ?   Effort: Pulmonary effort is normal.  ?   Breath sounds: Normal breath sounds. No wheezing or rales.  ?Abdominal:  ?   General: Bowel sounds are normal. There is no distension.  ?   Palpations: Abdomen is soft.  ?   Tenderness: There is abdominal tenderness in the suprapubic area. There is no guarding or rebound.  ?Genitourinary: ?   Labia:     ?   Right: No rash, tenderness or lesion.     ?   Left: No rash, tenderness or lesion.   ?   Cervix: Discharge present. No cervical motion tenderness, erythema or cervical bleeding.  ?   Uterus: Tender.   ?   Adnexa: Right adnexa normal and left adnexa normal.    ?   Right: No mass, tenderness or fullness.      ?   Left: No mass, tenderness or fullness.    ?Musculoskeletal:     ?   General: No tenderness. Normal range of  motion.  ?   Comments: No edema  ?Skin: ?   General: Skin is warm and dry.  ?   Findings: No rash.  ?Neurological:  ?   Mental Status: She is alert and oriented to person, place, and time.  ?   Cranial Nerves: No cranial nerve deficit.  ?Psychiatric:     ?   Behavior: Behavior normal.  ? ? ?ED Results / Procedures / Treatments   ?Labs ?(all labs ordered are listed, but only abnormal results are displayed) ?Labs Reviewed  ?WET PREP, GENITAL - Abnormal; Notable for the following components:  ?    Result Value  ? Clue Cells Wet Prep HPF POC PRESENT (*)   ? WBC, Wet Prep HPF POC >=10 (*)   ? All other components within normal limits  ?PREGNANCY, URINE - Abnormal; Notable for the following components:  ? Preg Test, Ur MED CTR HP MANUAL TESTING (*)   ? All other components within normal limits  ?COMPREHENSIVE METABOLIC PANEL - Abnormal; Notable for the following components:  ? Glucose, Bld 101 (*)    ? Calcium 8.8 (*)   ? All other components within normal limits  ?URINALYSIS, ROUTINE W REFLEX MICROSCOPIC  ?CBC WITH DIFFERENTIAL/PLATELET  ?LIPASE, BLOOD  ?RPR  ?HIV ANTIBODY (ROUTINE TESTING W REFLEX)  ?GC/CHLAMYDIA PROBE AMP ([place]) NOT AT [facility]  ? ? ?EKG ?None ? ?Radiology ?No results found. ? ?Procedures ?Procedures  ? ? ?Medications Ordered in ED ?Medications  ?cefTRIAXone (ROCEPHIN) 500 mg in dextrose 5 % 50 mL IVPB (has no administration in time range)  ?doxycycline (VIBRA-TABS) tablet 100 mg (has no administration in time range)  ?mupirocin cream (BACTROBAN) 2 % (has no administration in time range)  ? ? ?ED Course/ Medical Decision Making/ A&P ?  ?                        ?Medical Decision Making ?Amount and/or Complexity of Data Reviewed ?External Data Reviewed: notes. ?Labs: ordered. Decision-making details documented in ED Course. ? ?Risk ?Prescription drug management. ? ? ?Patient is presenting with 2 weeks of worsening dysuria, vaginal itching in the setting of being sexually active and not using protection regularly.  Patient does carry a higher risk.  She does not have symptoms suggestive of PID, TOA today.  She is requesting to be tested for HIV and syphilis as well.  I independently interpreted patient's labs today and her UA shows no evidence of UTI, CBC is within normal limits.  CMP and lipase are normal.  Wet prep is positive for clue cells as well as greater than than 10 white blood cells.  No evidence of yeast or trichomonas.  Given patient's symptoms and risk factors feel that she would benefit for treatment with Rocephin and doxycycline.  Findings were discussed with the patient.  All her questions were answered.  She has follow-up with her doctor next week. ? ? ? ? ? ? ? ?Final Clinical Impression(s) / ED Diagnoses ?Final diagnoses:  ?Pelvic pain in female  ? ? ?Rx / DC Orders ?ED Discharge Orders   ? ?      Ordered  ?  doxycycline (VIBRAMYCIN) 100 MG capsule  2 times daily       ?  05/26/21 2057  ?  phentermine 37.5 MG capsule  BH-each morning       ? 05/26/21 2057  ? ?  ?  ? ?  ? ? ?  ?Gwyneth Sprout, MD ?  05/26/21 2059 ? ?

## 2021-05-26 NOTE — Discharge Instructions (Signed)
You were tested for syphilis, HIV, gonorrhea and chlamydia.  Those test should come back tomorrow and will be present in your MyChart account.  The antibiotics you were given should treat this.  If it comes back positive you are already receiving treatment but your partner would need treatment if it is positive.  No sex for the next 1 week or until symptoms resolve. ?

## 2021-05-27 LAB — GC/CHLAMYDIA PROBE AMP (~~LOC~~) NOT AT ARMC
Chlamydia: NEGATIVE
Comment: NEGATIVE
Comment: NORMAL
Neisseria Gonorrhea: NEGATIVE

## 2021-05-27 LAB — RPR: RPR Ser Ql: NONREACTIVE

## 2021-05-27 LAB — PREGNANCY, URINE: Preg Test, Ur: NEGATIVE

## 2022-08-28 ENCOUNTER — Emergency Department (HOSPITAL_BASED_OUTPATIENT_CLINIC_OR_DEPARTMENT_OTHER)
Admission: EM | Admit: 2022-08-28 | Discharge: 2022-08-28 | Disposition: A | Discharge status: Home / Self Care | Payer: Managed Care, Other (non HMO) | Attending: Emergency Medicine | Admitting: Emergency Medicine

## 2022-08-28 ENCOUNTER — Encounter (HOSPITAL_BASED_OUTPATIENT_CLINIC_OR_DEPARTMENT_OTHER): Payer: Self-pay | Admitting: Emergency Medicine

## 2022-08-28 ENCOUNTER — Emergency Department (HOSPITAL_BASED_OUTPATIENT_CLINIC_OR_DEPARTMENT_OTHER): Payer: Managed Care, Other (non HMO)

## 2022-08-28 ENCOUNTER — Other Ambulatory Visit: Payer: Self-pay

## 2022-08-28 DIAGNOSIS — M25551 Pain in right hip: Secondary | ICD-10-CM | POA: Diagnosis present

## 2022-08-28 DIAGNOSIS — M5431 Sciatica, right side: Secondary | ICD-10-CM | POA: Insufficient documentation

## 2022-08-28 MED ORDER — NAPROXEN 250 MG PO TABS
500.0000 mg | ORAL_TABLET | Freq: Once | ORAL | Status: AC
  Administered 2022-08-28: 500 mg via ORAL
  Filled 2022-08-28: qty 2

## 2022-08-28 MED ORDER — DICLOFENAC SODIUM 50 MG PO TBEC: 50.0000 mg | DELAYED_RELEASE_TABLET | Freq: Two times a day (BID) | ORAL | 0 refills | Status: AC

## 2022-08-28 NOTE — Discharge Instructions (Addendum)
Recheck with your primary care provider if pain persists.  Take Voltaren as needed as prescribed.

## 2022-08-28 NOTE — ED Notes (Signed)
Discharge instructions reviewed with patient. Patient questions answered and opportunity for education reviewed. Patient voices understanding of discharge instructions with no further questions. Patient ambulatory with steady gait to lobby.  

## 2022-08-28 NOTE — ED Provider Notes (Signed)
[place] [facility] Provider Note   CSN: [id number] Arrival date & time: 08/28/22  1837     History  Chief Complaint  Patient presents with   Hip Pain    Erica Cisneros is a 57 y.o. female.  57 year old female presents with complaint of pain in her right anterior hip.  Patient states that she was at work on Friday when the shopping cart hit the conveyor belt and then hit her in the front of her hip.  She felt like she was okay until she woke up on Saturday and noticed that she was having pain, worse with walking.  No other injuries, complaints, concerns.       Home Medications Prior to Admission medications   Medication Sig Start Date End Date Taking? Authorizing Provider  diclofenac (VOLTAREN) 50 MG EC tablet Take 1 tablet (50 mg total) by mouth 2 (two) times daily for 10 days. 08/28/22 09/07/22 Yes Jeannie Fend, PA-C  phentermine 37.5 MG capsule Take 1 capsule (37.5 mg total) by mouth every morning. 05/26/21   Gwyneth Sprout, MD      Allergies    Penicillins and Soap    Review of Systems   Review of Systems Negative except as per HPI Physical Exam Updated Vital Signs BP (!) 142/105 (BP Location: Right Arm)   Pulse 67   Temp 98 F (36.7 C)   Resp 18   Ht 5\' 2"  (1.575 m)   Wt 83 kg   LMP 05/19/2014   SpO2 100%   BMI 33.47 kg/m  Physical Exam Vitals and nursing note reviewed.  Constitutional:      General: She is not in acute distress.    Appearance: She is well-developed. She is not diaphoretic.  HENT:     Head: Normocephalic and atraumatic.  Pulmonary:     Effort: Pulmonary effort is normal.  Musculoskeletal:       Back:     Comments: Ttp right SI, right lateral hip  Neurological:     Mental Status: She is alert and oriented to person, place, and time.  Psychiatric:        Behavior: Behavior normal.     ED Results / Procedures / Treatments   Labs (all labs ordered are listed, but only abnormal results are  displayed) Labs Reviewed - No data to display  EKG None  Radiology DG Hip Unilat With Pelvis 2-3 Views Right  Result Date: 08/28/2022 CLINICAL DATA:  Right hip pain after moving boxes on Saturday. EXAM: DG HIP (WITH OR WITHOUT PELVIS) 2-3V RIGHT COMPARISON:  None Available. FINDINGS: Degenerative changes in the lower lumbar spine and in both hips. Moderate degenerative changes in the right hip with narrowing and sclerosis of the acetabular joint and prominent osteophyte formation on both sides of the joint. No evidence of acute fracture or dislocation. No focal bone lesion or bone destruction. Bone cortex appears intact. Soft tissues are unremarkable. SI joints and symphysis pubis are not displaced. 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 No acute displaced fractures identified. Electronically Signed   By: Burman Nieves M.D.   On: 08/28/2022 19:53    Procedures Procedures    Medications Ordered in ED Medications  naproxen (NAPROSYN) tablet 500 mg (has no administration in time range)    ED Course/ Medical Decision Making/ A&P  Medical Decision Making Amount and/or Complexity of Data Reviewed Radiology: ordered.  Risk Prescription drug management.   58 year old female presents with complaint of pain in her right hip.  States that she hit her anterior hip on a shopping cart at work on Friday, noticed pain on Saturday.  Is taking Motrin with limited relief.  States that she had ankle pain a week ago that resolved.  Found to have tenderness in her right SI, right lateral hip.  No pain with logroll of the hip.  X-ray of the right hip with degenerative changes, no acute bony injury.  Patient reports Voltaren has worked well for her in the past, will discharge with prescription for same.  Advised to recheck with PCP if pain continues.        Final Clinical Impression(s) / ED Diagnoses Final diagnoses:  Right hip pain  Sciatica of  right side    Rx / DC Orders ED Discharge Orders          Ordered    diclofenac (VOLTAREN) 50 MG EC tablet  2 times daily        08/28/22 2023              Jeannie Fend, PA-C 08/28/22 2027    Virgina Norfolk, DO 08/28/22 2251

## 2022-08-28 NOTE — ED Triage Notes (Signed)
Patient arrives ambulatory by POV c/o right hip pain since Saturday. Reports on Friday shopping cart hit her in the hip area. Used Voltaren gel with minor relief.

## 2022-09-24 ENCOUNTER — Other Ambulatory Visit: Payer: Self-pay

## 2022-09-24 ENCOUNTER — Encounter (HOSPITAL_BASED_OUTPATIENT_CLINIC_OR_DEPARTMENT_OTHER): Payer: Self-pay

## 2022-09-24 ENCOUNTER — Emergency Department (HOSPITAL_BASED_OUTPATIENT_CLINIC_OR_DEPARTMENT_OTHER): Payer: Managed Care, Other (non HMO)

## 2022-09-24 ENCOUNTER — Emergency Department (HOSPITAL_BASED_OUTPATIENT_CLINIC_OR_DEPARTMENT_OTHER)
Admission: EM | Admit: 2022-09-24 | Discharge: 2022-09-24 | Disposition: A | Discharge status: Home / Self Care | Payer: Managed Care, Other (non HMO) | Attending: Emergency Medicine | Admitting: Emergency Medicine

## 2022-09-24 DIAGNOSIS — R102 Pelvic and perineal pain: Secondary | ICD-10-CM | POA: Diagnosis present

## 2022-09-24 DIAGNOSIS — N3001 Acute cystitis with hematuria: Secondary | ICD-10-CM

## 2022-09-24 DIAGNOSIS — N201 Calculus of ureter: Secondary | ICD-10-CM | POA: Insufficient documentation

## 2022-09-24 LAB — URINALYSIS, ROUTINE W REFLEX MICROSCOPIC
Bilirubin Urine: NEGATIVE
Glucose, UA: NEGATIVE mg/dL
Ketones, ur: NEGATIVE mg/dL
Nitrite: NEGATIVE
Protein, ur: >=300 mg/dL — AB
Specific Gravity, Urine: >=1.03 (ref 1.005–1.030)
pH: 6 (ref 5.0–8.0)

## 2022-09-24 LAB — WET PREP, GENITAL
Clue Cells Wet Prep HPF POC: NONE SEEN
Sperm: NONE SEEN
Trich, Wet Prep: NONE SEEN
WBC, Wet Prep HPF POC: <10 (ref <10)
Yeast Wet Prep HPF POC: NONE SEEN

## 2022-09-24 LAB — URINALYSIS, MICROSCOPIC (REFLEX)

## 2022-09-24 MED ORDER — CEPHALEXIN 500 MG PO CAPS: 500.0000 mg | ORAL_CAPSULE | Freq: Three times a day (TID) | ORAL | 0 refills | Status: AC

## 2022-09-24 MED ORDER — CEFTRIAXONE SODIUM 1 G IJ SOLR
1.0000 g | Freq: Once | INTRAMUSCULAR | Status: AC
  Administered 2022-09-24: 1 g via INTRAMUSCULAR
  Filled 2022-09-24: qty 10

## 2022-09-24 MED ORDER — TAMSULOSIN HCL 0.4 MG PO CAPS
0.4000 mg | ORAL_CAPSULE | Freq: Every day | ORAL | 0 refills | Status: AC
Start: 2022-09-24 — End: 2022-10-14

## 2022-09-24 MED ORDER — LIDOCAINE HCL (PF) 1 % IJ SOLN
INTRAMUSCULAR | Status: AC
  Administered 2022-09-24: 5 mL
  Filled 2022-09-24: qty 5

## 2022-09-24 MED ORDER — FLUCONAZOLE 200 MG PO TABS: 200.0000 mg | ORAL_TABLET | Freq: Once | ORAL | 0 refills | Status: AC

## 2022-09-24 MED ORDER — SODIUM CHLORIDE 0.9 % IV SOLN: 1.0000 g | Freq: Once | INTRAVENOUS | Status: DC

## 2022-09-24 NOTE — ED Triage Notes (Addendum)
Patient has been having pelvic pain for two weeks with vaginal itching. She took one diflucan and some antibiotics left over from a previous dental issue that has not been working.She is having pressure when she urinates and blood in her urine. She denied fever. She would like to be tested for STI.

## 2022-09-24 NOTE — ED Notes (Signed)
Discharge instructions discussed with pt. Pt verbalized understanding. Pt stable and ambulatory.  °

## 2022-09-24 NOTE — ED Provider Notes (Signed)
[place] [facility] Provider Note   CSN: [id number] Arrival date & time: 09/24/22  2725     History  Chief Complaint  Patient presents with   Pelvic Pain   Vaginal Itching    Erica Cisneros is a 57 y.o. female.  Patient is a 57 year old female presenting for complaints of vaginal itching x 2 weeks.  Patient admits to vaginal pruritus, feelings of pelvic pressure, increased feelings of urinary frequency but decreased urination, and increased vaginal discharge.  She states she originally thought she might have a urinary tract infection so she took some her previous antibiotics, Clindamycin, originally given to her after a dental procedure.  No urine analysis at this time.  She states she then took Diflucan.  States her symptoms originally improved but then got worse.  Denies any genital sores at this time.  She denies any new sexual partners.  She denies fevers or chills.  Admits to some blood in urine.  Denies flank pain.  The history is provided by the patient. No language interpreter was used.  Pelvic Pain Pertinent negatives include no chest pain, no abdominal pain and no shortness of breath.  Vaginal Itching Pertinent negatives include no chest pain, no abdominal pain and no shortness of breath.       Home Medications Prior to Admission medications   Medication Sig Start Date End Date Taking? Authorizing Provider  cephALEXin (KEFLEX) 500 MG capsule Take 1 capsule (500 mg total) by mouth 3 (three) times daily for 14 days. 09/24/22 10/08/22 Yes Edwin Dada P, DO  tamsulosin (FLOMAX) 0.4 MG CAPS capsule Take 1 capsule (0.4 mg total) by mouth daily for 20 days. 09/24/22 10/14/22 Yes Edwin Dada P, DO  phentermine 37.5 MG capsule Take 1 capsule (37.5 mg total) by mouth every morning. 05/26/21   Gwyneth Sprout, MD      Allergies    Penicillins and Soap    Review of Systems   Review of Systems  Constitutional:  Negative for chills and fever.   HENT:  Negative for ear pain and sore throat.   Eyes:  Negative for pain and visual disturbance.  Respiratory:  Negative for cough and shortness of breath.   Cardiovascular:  Negative for chest pain and palpitations.  Gastrointestinal:  Negative for abdominal pain and vomiting.  Genitourinary:  Positive for pelvic pain, vaginal discharge and vaginal pain. Negative for difficulty urinating, dysuria, flank pain and hematuria.  Musculoskeletal:  Negative for arthralgias and back pain.  Skin:  Negative for color change and rash.  Neurological:  Negative for seizures and syncope.  All other systems reviewed and are negative.   Physical Exam Updated Vital Signs BP 130/80   Pulse 73   Temp 98.8 F (37.1 C) (Oral)   Resp 18   Ht 5\' 2"  (1.575 m)   Wt 83 kg   LMP 05/19/2014   SpO2 99%   BMI 33.47 kg/m  Physical Exam Vitals and nursing note reviewed.  Constitutional:      General: She is not in acute distress.    Appearance: She is well-developed.  HENT:     Head: Normocephalic and atraumatic.  Eyes:     Conjunctiva/sclera: Conjunctivae normal.  Cardiovascular:     Rate and Rhythm: Normal rate and regular rhythm.     Heart sounds: No murmur heard. Pulmonary:     Effort: Pulmonary effort is normal. No respiratory distress.     Breath sounds: Normal breath sounds.  Abdominal:  Palpations: Abdomen is soft.     Tenderness: There is no abdominal tenderness.  Musculoskeletal:        General: No swelling.     Cervical back: Neck supple.  Skin:    General: Skin is warm and dry.     Capillary Refill: Capillary refill takes less than 2 seconds.  Neurological:     Mental Status: She is alert.  Psychiatric:        Mood and Affect: Mood normal.     ED Results / Procedures / Treatments   Labs (all labs ordered are listed, but only abnormal results are displayed) Labs Reviewed  URINALYSIS, ROUTINE W REFLEX MICROSCOPIC - Abnormal; Notable for the following components:       Result Value   APPearance CLOUDY (*)    Hgb urine dipstick MODERATE (*)    Protein, ur >=300 (*)    Leukocytes,Ua SMALL (*)    All other components within normal limits  URINALYSIS, MICROSCOPIC (REFLEX) - Abnormal; Notable for the following components:   Bacteria, UA FEW (*)    All other components within normal limits  WET PREP, GENITAL  GC/CHLAMYDIA PROBE AMP ([place]) NOT AT [facility]    EKG None  Radiology CT Renal Stone Study  Result Date: 09/24/2022 CLINICAL DATA:  Symptomatic urolithiasis. Pelvic pain for 2 weeks with vaginal itching. EXAM: CT ABDOMEN AND PELVIS WITHOUT CONTRAST TECHNIQUE: Multidetector CT imaging of the abdomen and pelvis was performed following the standard protocol without IV contrast. RADIATION DOSE REDUCTION: This exam was performed according to the departmental dose-optimization program which includes automated exposure control, adjustment of the mA and/or kV according to patient size and/or use of iterative reconstruction technique. COMPARISON:  None Available. FINDINGS: Lower chest:  No contributory findings. Hepatobiliary: No focal liver abnormality.No evidence of biliary obstruction or stone. Pancreas: Unremarkable. Spleen: Unremarkable. Adrenals/Urinary Tract: Negative adrenals. Mild right hydronephrosis with suspected tiny calculus highlighted on sagittal and axial images of the right UVJ. Collapsed urinary bladder which is unremarkable. Stomach/Bowel:  No obstruction. No appendicitis. Vascular/Lymphatic: No acute vascular abnormality. No mass or adenopathy. Reproductive:No pathologic findings. Other: No ascites or pneumoperitoneum. Musculoskeletal: No acute abnormalities. IMPRESSION: Mild right hydroureteronephrosis with suspected underlying tiny UVJ calculus. Electronically Signed   By: Tiburcio Pea M.D.   On: 09/24/2022 10:27    Procedures Procedures    Medications Ordered in ED Medications  cefTRIAXone (ROCEPHIN) 1 g in sodium chloride 0.9 % 100 mL  IVPB (has no administration in time range)    ED Course/ Medical Decision Making/ A&P Clinical Course as of 09/24/22 1105  Sun Sep 24, 2022  1005 WBC Clumps: PRESENT [AG]    Clinical Course User Index [AG] Franne Forts, DO                             Medical Decision Making Amount and/or Complexity of Data Reviewed Labs: ordered. Decision-making details documented in ED Course. Radiology: ordered.   66:20 AM 57 year old female presenting for complaints of vaginal itching x 2 weeks.  Patient admits to vaginal pruritus, feelings of pelvic pressure, increased feelings of urinary frequency but decreased urination, and increased vaginal discharge.  Continued hematuria that started today.  Patient is alert oriented x 3, no acute distress, afebrile, stable vital signs.  Abdomen is soft and nontender.  GU exam demonstrates some white discharge only.  Wet mount demonstrates no yeast.  No trichomonas.  Gonorrhea and Chlamydia PCR sent.  UA demonstrates mild urinary tract infection.  Keflex given.  Significant hematuria present.  CT renal stone demonstrates mild right hydroureteronephrosis with suspected underlying tiny UVJ calculus.  No signs or symptoms of sepsis.  Patient given urology outpatient recommendations for follow-up.  Patient in no distress and overall condition improved here in the ED. Detailed discussions were had with the patient regarding current findings, and need for close f/u with PCP or on call doctor. The patient has been instructed to return immediately if the symptoms worsen in any way for re-evaluation. Patient verbalized understanding and is in agreement with current care plan. All questions answered prior to discharge.          Final Clinical Impression(s) / ED Diagnoses Final diagnoses:  Ureterolithiasis  Acute cystitis with hematuria    Rx / DC Orders ED Discharge Orders          Ordered    cephALEXin (KEFLEX) 500 MG capsule  3 times daily         09/24/22 1104    tamsulosin (FLOMAX) 0.4 MG CAPS capsule  Daily        09/24/22 1104              Franne Forts, DO 09/24/22 1105

## 2022-09-24 NOTE — Discharge Instructions (Addendum)
Call make an appointment with a urologist or the 1 provided for renal stone follow-up mild urinary tract infection.  Occasion for Flomax prescribed to pharmacy.  This will help dilate your ureters so that you can pass the kidney stone.  Prescription for Keflex sent to pharmacy for antibiotics for urinary tract infection likely secondary to stone.

## 2022-09-25 LAB — GC/CHLAMYDIA PROBE AMP (~~LOC~~) NOT AT ARMC
Chlamydia: NEGATIVE
Comment: NEGATIVE
Comment: NORMAL
Neisseria Gonorrhea: NEGATIVE

## 2022-12-12 ENCOUNTER — Other Ambulatory Visit: Payer: Self-pay

## 2022-12-12 ENCOUNTER — Emergency Department (HOSPITAL_BASED_OUTPATIENT_CLINIC_OR_DEPARTMENT_OTHER): Payer: Managed Care, Other (non HMO)

## 2022-12-12 ENCOUNTER — Emergency Department (HOSPITAL_BASED_OUTPATIENT_CLINIC_OR_DEPARTMENT_OTHER)
Admission: EM | Admit: 2022-12-12 | Discharge: 2022-12-12 | Disposition: A | Discharge status: Home / Self Care | Payer: Managed Care, Other (non HMO) | Attending: Emergency Medicine | Admitting: Emergency Medicine

## 2022-12-12 ENCOUNTER — Encounter (HOSPITAL_BASED_OUTPATIENT_CLINIC_OR_DEPARTMENT_OTHER): Payer: Self-pay

## 2022-12-12 DIAGNOSIS — M25572 Pain in left ankle and joints of left foot: Secondary | ICD-10-CM | POA: Diagnosis present

## 2022-12-12 DIAGNOSIS — M7662 Achilles tendinitis, left leg: Secondary | ICD-10-CM | POA: Diagnosis not present

## 2022-12-12 MED ORDER — KETOROLAC TROMETHAMINE 15 MG/ML IJ SOLN
15.0000 mg | Freq: Once | INTRAMUSCULAR | Status: AC
  Administered 2022-12-12: 15 mg via INTRAMUSCULAR
  Filled 2022-12-12: qty 1

## 2022-12-12 NOTE — ED Triage Notes (Signed)
Patient here POV from Home.  Endorses Pain to Left Ankle for about 3 Weeks. Worse with Movement. Worsened today. No known Trauma. Noted numbness to Right toes that began a few months ago and has since subsided but now notes pain to right toes.   NAD Noted during Triage. A&Ox4. GCS 15. BIB Wheelchair.

## 2022-12-12 NOTE — ED Notes (Signed)
Patient did not want crutches has some at home.

## 2022-12-12 NOTE — ED Provider Notes (Signed)
[name] [facility] Provider Note   CSN: [id number] Arrival date & time: 12/12/22  5638     History  Chief Complaint  Patient presents with   Ankle Pain    Erica Cisneros is a 57 y.o. female.  57 yo F with a chief complaints of left ankle pain.  This has been going on for about 3 weeks.  She denies any injury.  Had injured that left ankle in the past and has plate and screws in there.  She had some worsening pain today and almost fell while she was at work and her employer made her come to be evaluated.   Ankle Pain      Home Medications Prior to Admission medications   Medication Sig Start Date End Date Taking? Authorizing Provider  phentermine 37.5 MG capsule Take 1 capsule (37.5 mg total) by mouth every morning. 05/26/21   Gwyneth Sprout, MD      Allergies    Penicillins and Soap    Review of Systems   Review of Systems  Physical Exam Updated Vital Signs BP (!) 160/111   Pulse (!) 54   Temp 97.7 F (36.5 C) (Oral)   Resp 18   Ht 5\' 3"  (1.6 m)   Wt 79.4 kg   LMP 05/19/2014   SpO2 100%   BMI 31.00 kg/m  Physical Exam Vitals and nursing note reviewed.  Constitutional:      General: She is not in acute distress.    Appearance: She is well-developed. She is not diaphoretic.  HENT:     Head: Normocephalic and atraumatic.  Eyes:     Pupils: Pupils are equal, round, and reactive to light.  Cardiovascular:     Rate and Rhythm: Normal rate and regular rhythm.     Heart sounds: No murmur heard.    No friction rub. No gallop.  Pulmonary:     Effort: Pulmonary effort is normal.     Breath sounds: No wheezing or rales.  Abdominal:     General: There is no distension.     Palpations: Abdomen is soft.     Tenderness: There is no abdominal tenderness.  Musculoskeletal:        General: No tenderness.     Cervical back: Normal range of motion and neck supple.     Comments: Patient with tenderness mostly about the  posterior aspect of the ankle.  She has some pain just behind the lateral malleolus.  She has some mild tenderness over the Achilles.  No obvious defect noted.  With squeeze of the gastrocs she does have flexion of the foot.  Pulse motor and sensation intact distally.  Skin:    General: Skin is warm and dry.  Neurological:     Mental Status: She is alert and oriented to person, place, and time.  Psychiatric:        Behavior: Behavior normal.     ED Results / Procedures / Treatments   Labs (all labs ordered are listed, but only abnormal results are displayed) Labs Reviewed - No data to display  EKG None  Radiology No results found.  Procedures Procedures    Medications Ordered in ED Medications  ketorolac (TORADOL) 15 MG/ML injection 15 mg (15 mg Intramuscular Given 12/12/22 7564)    ED Course/ Medical Decision Making/ A&P  Medical Decision Making Amount and/or Complexity of Data Reviewed Radiology: ordered.  Risk Prescription drug management.   57 yo F with a chief complaint of left ankle pain.  Patient seems to localize his pain to the Achilles.  I suspect likely she has Achilles tendinitis.  Will obtain a plain film.  Plain film of the left ankle independently interpreted by me without any obvious hardware displacement no acute fracture.  Will place in an ASO crutches have her follow-up with orthopedics in the office.  9:50 AM:  I have discussed the diagnosis/risks/treatment options with the patient.  Evaluation and diagnostic testing in the emergency department does not suggest an emergent condition requiring admission or immediate intervention beyond what has been performed at this time.  They will follow up with Ortho. We also discussed returning to the ED immediately if new or worsening sx occur. We discussed the sx which are most concerning (e.g., sudden worsening pain, fever, inability to tolerate by mouth) that necessitate immediate  return. Medications administered to the patient during their visit and any new prescriptions provided to the patient are listed below.  Medications given during this visit Medications  ketorolac (TORADOL) 15 MG/ML injection 15 mg (15 mg Intramuscular Given 12/12/22 1610)     The patient appears reasonably screen and/or stabilized for discharge and I doubt any other medical condition or other [facility] requiring further screening, evaluation, or treatment in the ED at this time prior to discharge.          Final Clinical Impression(s) / ED Diagnoses Final diagnoses:  Achilles tendinitis of left lower extremity    Rx / DC Orders ED Discharge Orders     None         Melene Plan, DO 12/12/22 [phone]

## 2022-12-12 NOTE — Discharge Instructions (Signed)
Your x-ray looks good to me.  Please follow-up with your orthopedic doctor in the office.  I will have you try to keep your weight off of it and max of the over-the-counter medications to try and help you with this.  Dosing is below.  Take 4 over the counter ibuprofen tablets 3 times a day or 2 over-the-counter naproxen tablets twice a day for pain. Also take tylenol 1000mg (2 extra strength) four times a day.

## 2022-12-21 ENCOUNTER — Ambulatory Visit: Payer: Managed Care, Other (non HMO) | Admitting: Orthopaedic Surgery

## 2022-12-21 ENCOUNTER — Encounter: Payer: Self-pay | Admitting: Orthopaedic Surgery

## 2022-12-21 DIAGNOSIS — M25572 Pain in left ankle and joints of left foot: Secondary | ICD-10-CM

## 2022-12-21 MED ORDER — PREDNISONE 10 MG (21) PO TBPK: ORAL_TABLET | ORAL | 3 refills | Status: AC

## 2022-12-21 MED ORDER — NAPROXEN 500 MG PO TABS: 500.0000 mg | ORAL_TABLET | Freq: Two times a day (BID) | ORAL | 3 refills | Status: AC

## 2022-12-21 NOTE — Progress Notes (Signed)
Office Visit Note   Patient: Erica Cisneros           Date of Birth: 21-Jun-1965           MRN: [id number] Visit Date: 12/21/2022              Requested by: No referring provider defined for this encounter. PCP: Pcp, No   Assessment & Plan: Visit Diagnoses:  1. 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  Treatment options were discussed and we will start with prescription for prednisone Dosepak and 2 weeks of naproxen.  Cam boot for immobilization wean as tolerated.  Work note provided.  She will follow-up for an ankle injection if symptoms do not improve.  Follow-Up Instructions: No follow-ups on file.   Orders:  No orders of the defined types were placed in this encounter.  No orders of the defined types were placed in this encounter.     Procedures: No procedures performed   Clinical Data: No additional findings.   Subjective: Chief Complaint  Patient presents with   Left Ankle - Pain    HPI Erica Cisneros is a 57 year old female who I took care of about 6 years ago for a left ankle fracture.  She comes in for evaluation of left ankle pain for 2 months.  Denies any injuries.  She has had to miss work a couple times because of the symptoms.  She has pain throughout the ankle and the lower leg.  She has been using crutches.  Denies any injuries.  She has been using ASO and naproxen. Review of Systems  Constitutional: Negative.   HENT: Negative.    Eyes: Negative.   Respiratory: Negative.    Cardiovascular: Negative.   Endocrine: Negative.   Musculoskeletal: Negative.   Neurological: Negative.   Hematological: Negative.   Psychiatric/Behavioral: Negative.    All other systems reviewed and are negative.    Objective: Vital Signs: LMP 05/19/2014   Physical Exam Vitals and nursing note reviewed.  Constitutional:      Appearance: She is well-developed.  HENT:     Head:  Atraumatic.     Nose: Nose normal.  Eyes:     Extraocular Movements: Extraocular movements intact.  Cardiovascular:     Pulses: Normal pulses.  Pulmonary:     Effort: Pulmonary effort is normal.  Abdominal:     Palpations: Abdomen is soft.  Musculoskeletal:     Cervical back: Neck supple.  Skin:    General: Skin is warm.     Capillary Refill: Capillary refill takes less than 2 seconds.  Neurological:     Mental Status: She is alert. Mental status is at baseline.  Psychiatric:        Behavior: Behavior normal.        Thought Content: Thought content normal.        Judgment: Judgment normal.     Ortho Exam Exam of the left ankle shows mild swelling of the ankle joint.  Pain with range of motion.  Fully healed surgical scar.  No signs of infection. Specialty Comments:  No specialty comments available.  Imaging: No results found.   PMFS History: Patient Active Problem List   Diagnosis Date Noted   Closed displaced fracture of lateral malleolus of fibula with routine healing    Left ankle injury, initial encounter 04/27/2016   Low back pain 04/27/2016   Past Medical  History:  Diagnosis Date   Cough 05/01/2016   Depression    History of bronchitis    Lateral malleolar fracture 04/22/2016   left   Stuffy nose 05/01/2016    No family history on file.  Past Surgical History:  Procedure Laterality Date   ORIF ANKLE FRACTURE Left 05/03/2016   Procedure: OPEN REDUCTION INTERNAL FIXATION (ORIF) LEFT ANKLE FRACTURE;  Surgeon: Tarry Kos, MD;  Location: [facility];  Service: Orthopedics;  Laterality: Left;   TUBAL LIGATION     Social History   Occupational History   Not on file  Tobacco Use   Smoking status: Never   Smokeless tobacco: Never  Vaping Use   Vaping status: Never Used  Substance and Sexual Activity   Alcohol use: Yes    Comment: socially   Drug use: No   Sexual activity: Yes    Birth control/protection: Surgical

## 2022-12-28 ENCOUNTER — Telehealth: Payer: Self-pay | Admitting: Orthopaedic Surgery

## 2022-12-28 NOTE — Telephone Encounter (Signed)
Patient is requesting FMLA for intermittent leave, "flare-ups" 1-2 x week lasting 8-10 hours and for appointments 1-2 x month 4 hours. Please advise. Provide note if approved. Thank you!

## 2022-12-28 NOTE — Telephone Encounter (Signed)
yes

## 2022-12-29 NOTE — Telephone Encounter (Signed)
Noted for [employer]. 

## 2023-01-01 ENCOUNTER — Telehealth: Payer: Self-pay | Admitting: Orthopaedic Surgery

## 2023-01-01 NOTE — Telephone Encounter (Signed)
Patient came by the office checking on her forms. I advised they were faxed on 12/29/22. I gave her a copy and advised her that I would re-fax to [employer], fax [phone]

## 2024-01-30 ENCOUNTER — Other Ambulatory Visit: Payer: Self-pay

## 2024-01-30 ENCOUNTER — Encounter (HOSPITAL_BASED_OUTPATIENT_CLINIC_OR_DEPARTMENT_OTHER): Payer: Self-pay

## 2024-01-30 ENCOUNTER — Emergency Department (HOSPITAL_BASED_OUTPATIENT_CLINIC_OR_DEPARTMENT_OTHER)
Admission: EM | Admit: 2024-01-30 | Discharge: 2024-01-30 | Disposition: A | Discharge status: Home / Self Care | Attending: Emergency Medicine | Admitting: Emergency Medicine

## 2024-01-30 DIAGNOSIS — I10 Essential (primary) hypertension: Secondary | ICD-10-CM | POA: Insufficient documentation

## 2024-01-30 DIAGNOSIS — K0889 Other specified disorders of teeth and supporting structures: Secondary | ICD-10-CM

## 2024-01-30 DIAGNOSIS — K029 Dental caries, unspecified: Secondary | ICD-10-CM | POA: Diagnosis not present

## 2024-01-30 LAB — URINALYSIS, ROUTINE W REFLEX MICROSCOPIC
Bilirubin Urine: NEGATIVE
Glucose, UA: NEGATIVE mg/dL
Hgb urine dipstick: NEGATIVE
Ketones, ur: NEGATIVE mg/dL
Leukocytes,Ua: NEGATIVE
Nitrite: NEGATIVE
Protein, ur: NEGATIVE mg/dL
Specific Gravity, Urine: >=1.03 (ref 1.005–1.030)
pH: 6 (ref 5.0–8.0)

## 2024-01-30 LAB — WET PREP, GENITAL
Clue Cells Wet Prep HPF POC: NONE SEEN
Sperm: NONE SEEN
Trich, Wet Prep: NONE SEEN
WBC, Wet Prep HPF POC: <10 (ref <10)
Yeast Wet Prep HPF POC: NONE SEEN

## 2024-01-30 MED ORDER — HYDROCODONE-ACETAMINOPHEN 5-325 MG PO TABS: 1.0000 | ORAL_TABLET | Freq: Four times a day (QID) | ORAL | 0 refills | Status: AC | PRN

## 2024-01-30 MED ORDER — CLINDAMYCIN HCL 150 MG PO CAPS: 300.0000 mg | ORAL_CAPSULE | Freq: Four times a day (QID) | ORAL | 0 refills | Status: DC

## 2024-01-30 MED ORDER — FLUCONAZOLE 150 MG PO TABS: ORAL_TABLET | ORAL | 0 refills | Status: DC

## 2024-01-30 NOTE — ED Triage Notes (Addendum)
 Left lower dental pain x 2 weeks. Took left over antibiotics and pain subsided. Pain returned yesterday. Foul smell from bladder or urine. Denies vaginal discharge , urinary burning, or abdominal pain Neck is hurting Rash on back that bleeds when scratches

## 2024-01-30 NOTE — ED Provider Notes (Signed)
 [place] [facility] DEPARTMENT AT [facility] Provider Note   CSN: [id number] Arrival date & time: 01/30/24  9072     Patient presents with: Dental Pain and multiple complaints   Erica Cisneros is a 58 y.o. female.   Patient presents to the emergency department today for evaluation of multiple complaints.  Her main complaint is left mandibular and jaw pain that she feels is due to a tooth infection.  This has become recurrent.  She states that she took some leftover antibiotics, does not know the name, that she had at home.  Symptoms then worsened.  Pain radiates to the left face.  Unrelieved with 800 mg ibuprofen.  No difficulty breathing or swallowing.  Patient also reports an odor with urination.  Unclear if she has a vaginal infection or UTI.  No burning with urination.  No blood noted in urine.  She denies vaginal bleeding.  She is sexually active with 1 partner.  Unknown about STI.       Prior to Admission medications   Medication Sig Start Date End Date Taking? Authorizing Provider  naproxen  (NAPROSYN ) 500 MG tablet Take 1 tablet (500 mg total) by mouth 2 (two) times daily with a meal. 12/21/22   Jerri Kay HERO, MD  phentermine  37.5 MG capsule Take 1 capsule (37.5 mg total) by mouth every morning. 05/26/21   Doretha Folks, MD  predniSONE  (STERAPRED UNI-PAK 21 TAB) 10 MG (21) TBPK tablet Take as directed 12/21/22   Jerri Kay HERO, MD    Allergies: Penicillins and Soap    Review of Systems  Updated Vital Signs BP (!) 178/83   Pulse (!) 58   Temp 98.3 F (36.8 C)   Resp 18   LMP 05/19/2014   SpO2 100%   Physical Exam Vitals and nursing note reviewed.  Constitutional:      Appearance: She is well-developed.  HENT:     Head: Normocephalic and atraumatic.     Jaw: No trismus.     Comments: Mild gingivitis left mandibular gumline.  Several previously extracted teeth.    Right Ear: Tympanic membrane, ear canal and external ear normal.     Left Ear:  Tympanic membrane, ear canal and external ear normal.     Nose: Nose normal.     Mouth/Throat:     Dentition: Abnormal dentition. Dental caries present. No dental abscesses.     Pharynx: Uvula midline. No uvula swelling.     Tonsils: No tonsillar abscesses.  Eyes:     Conjunctiva/sclera: Conjunctivae normal.  Neck:     Comments: No neck swelling or Ludwig's angina Abdominal:     Palpations: Abdomen is soft.     Tenderness: There is no abdominal tenderness. There is no guarding or rebound.  Musculoskeletal:     Cervical back: Normal range of motion and neck supple.  Lymphadenopathy:     Cervical: No cervical adenopathy.  Skin:    General: Skin is warm and dry.  Neurological:     Mental Status: She is alert.     (all labs ordered are listed, but only abnormal results are displayed) Labs Reviewed  WET PREP, GENITAL  URINALYSIS, ROUTINE W REFLEX MICROSCOPIC  GC/CHLAMYDIA PROBE AMP ([place]) NOT AT [facility]    EKG: None  Radiology: No results found.   Procedures   Medications Ordered in the ED - No data to display  ED Course  Patient seen and examined. History obtained directly from patient.   Labs/EKG: Ordered wet prep/GC  chlamydia testing.  Patient will self swab  Imaging: None ordered  Medications/Fluids: None ordered  Most recent vital signs reviewed and are as follows: BP (!) 178/83   Pulse (!) 58   Temp 98.3 F (36.8 C)   Resp 18   LMP 05/19/2014   SpO2 100%   Initial impression: Dental pain, likely dental infection.  Will treat with Augmentin and short course of pain medication.  Will also evaluate for possible vaginal infection.  No severe pelvic pain or discharge suggest PID.  Question yeast from recent antibiotic use.   11:18 AM Reassessment performed. Patient appears stable.  Labs personally reviewed and interpreted including: Negative UA, negative wet prep.  Reviewed pertinent lab work and imaging with patient at bedside. Questions answered.    Most current vital signs reviewed and are as follows: BP (!) 178/83   Pulse (!) 58   Temp 98.3 F (36.8 C)   Resp 18   LMP 05/19/2014   SpO2 100%   Plan: Discharge to home.   Prescriptions written for: Clindamycin  (penicillin allergy), Diflucan  in case of yeast infection on antibiotic, # 6 tablets Vicodin for pain  Patient counseled on use of narcotic pain medications. Counseled not to combine these medications with others containing tylenol . Urged not to drink alcohol, drive, or perform any other activities that requires focus while taking these medications. The patient verbalizes understanding and agrees with the plan.  Other home care instructions discussed: Avoidance of triggers  ED return instructions discussed: Difficulties breathing or swallowing  Follow-up instructions discussed: Patient encouraged to follow-up with their dentist as soon as possible                                    Medical Decision Making Amount and/or Complexity of Data Reviewed Labs: ordered.  Risk Prescription drug management.   Patient presents for dental pain. They do not have a fever and do not appear septic. Exam unconcerning for Ludwig's angina or other deep tissue infection in neck and I do not feel that advanced imaging is indicated at this time. Low suspicion for PTA, RPA, epiglottis based on exam.   Patient will be treated for dental infection with antibiotic. Encouraged tylenol /NSAIDs as prescribed or as directed on the packaging for pain.  Small amount of Vicodin given for more severe pain.  Encouraged follow-up with a dentist for definitive and long-term management.   Patient was also concerned about a vaginal odor.  Her wet prep is negative.  She does not appear to have a UTI.  GC/chlamydia testing pending.  Diflucan  given in case she does develop a yeast infection with current antibiotics.  Otherwise outpatient follow-up recommended.  The patient's vital signs, pertinent lab work  and imaging were reviewed and interpreted as discussed in the ED course. Hospitalization was considered for further testing, treatments, or serial exams/observation. However as patient is well-appearing, has a stable exam, and reassuring studies today, I do not feel that they warrant admission at this time. This plan was discussed with the patient who verbalizes agreement and comfort with this plan and seems reliable and able to return to the Emergency Department with worsening or changing symptoms.        Final diagnoses:  Pain, dental  Primary hypertension    ED Discharge Orders          Ordered    clindamycin  (CLEOCIN ) 150 MG capsule  Every 6 hours  01/30/24 1116    fluconazole  (DIFLUCAN ) 150 MG tablet        01/30/24 1116    HYDROcodone -acetaminophen  (NORCO/VICODIN) 5-325 MG tablet  Every 6 hours PRN        01/30/24 1116               Chevon Laufer, PA-C 01/30/24 1120    Freddi Hamilton, MD 01/30/24 1337

## 2024-01-30 NOTE — ED Notes (Signed)
 Reviewed discharge instructions, follow up and medications with pt. Pt states understanding. Ambulatory at discharge

## 2024-01-30 NOTE — Discharge Instructions (Signed)
 Please read and follow all provided instructions.  Your diagnoses today include:  1. Pain, dental   2. Primary hypertension    The exam and treatment you received today has been provided on an emergency basis only. This is not a substitute for complete medical or dental care.  Tests performed today include: Vital signs. See below for your results today.  Wet prep: No signs of vaginal infection Urine testing: No signs of UTI Gonorrhea and Chlamydia testing: Pending results, you will be notified with positive results and results will be available in MyChart.  Medications prescribed:  Clindamycin  - antibiotic  You have been prescribed an antibiotic medicine: take the entire course of medicine even if you are feeling better. Stopping early can cause the antibiotic not to work.  Vicodin (hydrocodone /acetaminophen ) - narcotic pain medication  DO NOT drive or perform any activities that require you to be awake and alert because this medicine can make you drowsy. BE VERY CAREFUL not to take multiple medicines containing Tylenol  (also called acetaminophen ). Doing so can lead to an overdose which can damage your liver and cause liver failure and possibly death.  Diflucan  - Medication for yeast infection if this develops  Take any prescribed medications only as directed.  Home care instructions:  Follow any educational materials contained in this packet.  Follow-up instructions: Please follow-up with your dentist for further evaluation of your symptoms.   Return instructions:  Please return to the Emergency Department if you experience worsening symptoms. Please return if you develop a fever, you develop more swelling in your face or neck, you have trouble breathing or swallowing food. Please return if you have any other emergent concerns.  Additional Information:  Your vital signs today were: BP (!) 178/83   Pulse (!) 58   Temp 98.3 F (36.8 C)   Resp 18   LMP 05/19/2014   SpO2  100%  If your blood pressure (BP) was elevated above 135/85 this visit, please have this repeated by your doctor within one month. --------------

## 2024-01-31 LAB — GC/CHLAMYDIA PROBE AMP (~~LOC~~) NOT AT ARMC
Chlamydia: NEGATIVE
Comment: NEGATIVE
Comment: NORMAL
Neisseria Gonorrhea: NEGATIVE

## 2024-03-17 ENCOUNTER — Emergency Department (HOSPITAL_BASED_OUTPATIENT_CLINIC_OR_DEPARTMENT_OTHER)
Admission: EM | Admit: 2024-03-17 | Discharge: 2024-03-17 | Disposition: A | Discharge status: Home / Self Care | Attending: Emergency Medicine | Admitting: Emergency Medicine

## 2024-03-17 ENCOUNTER — Encounter (HOSPITAL_BASED_OUTPATIENT_CLINIC_OR_DEPARTMENT_OTHER): Payer: Self-pay

## 2024-03-17 ENCOUNTER — Other Ambulatory Visit: Payer: Self-pay

## 2024-03-17 DIAGNOSIS — R21 Rash and other nonspecific skin eruption: Secondary | ICD-10-CM | POA: Diagnosis present

## 2024-03-17 DIAGNOSIS — K047 Periapical abscess without sinus: Secondary | ICD-10-CM | POA: Diagnosis not present

## 2024-03-17 DIAGNOSIS — L249 Irritant contact dermatitis, unspecified cause: Secondary | ICD-10-CM | POA: Insufficient documentation

## 2024-03-17 MED ORDER — FLUCONAZOLE 150 MG PO TABS: ORAL_TABLET | ORAL | 0 refills | Status: AC

## 2024-03-17 MED ORDER — CLINDAMYCIN HCL 300 MG PO CAPS: 300.0000 mg | ORAL_CAPSULE | Freq: Four times a day (QID) | ORAL | 0 refills | Status: AC

## 2024-03-17 MED ORDER — HYDROXYZINE HCL 25 MG PO TABS: 25.0000 mg | ORAL_TABLET | Freq: Four times a day (QID) | ORAL | 0 refills | Status: AC

## 2024-03-17 MED ORDER — HYDROCORTISONE 2.5 % EX LOTN: TOPICAL_LOTION | Freq: Two times a day (BID) | CUTANEOUS | 0 refills | Status: AC

## 2024-03-17 NOTE — Discharge Instructions (Signed)
 It was a pleasure taking care of you here today  Use the cream for your rash, do not use the cream on your face.  Hydroxyzine  as needed for itching use caution as may make you sleepy.  I have written you for antibiotics for dental infection as well as given you a prescription for Diflucan  in case you develop a yeast infection  Make sure to follow-up with your dentist  Return for new or worsening symptoms

## 2024-03-17 NOTE — ED Provider Notes (Signed)
 " [place] [facility] AT [facility] Provider Note   CSN: [id number] Arrival date & time: 03/17/24  1650     Patient presents with: Rash  Erica Cisneros is a 58 y.o. female here for evaluation of rash.  Was outside this weekend around [place] cleaning out coolers.  Noted to develop rash to the insides of her bilateral lower legs from calves to thighs.  Pruritic in nature.  Subsequently developed rash to the inner aspect of her right biceps and forearm and her left forearm.  No numbness or weakness.  No rash to palms or soles.  No mucous membrane rash.  No fever.  She also notes she has had pain to her left lower dentition.  She has multiple issues with the same tooth.  States she gets recurrent dental abscesses in this area.  Has not been able to see a dentist.  No facial swelling, drooling.  Able to eat and drink at home.   HPI     Prior to Admission medications  Medication Sig Start Date End Date Taking? Authorizing Provider  clindamycin  (CLEOCIN ) 300 MG capsule Take 1 capsule (300 mg total) by mouth every 6 (six) hours for 5 days. 03/17/24 03/22/24 Yes Jayd Forrey A, PA-C  hydrocortisone 2.5 % lotion Apply topically 2 (two) times daily. 03/17/24  Yes Brianah Hopson A, PA-C  hydrOXYzine  (ATARAX ) 25 MG tablet Take 1 tablet (25 mg total) by mouth every 6 (six) hours. 03/17/24  Yes Rickie Gange A, PA-C  fluconazole  (DIFLUCAN ) 150 MG tablet Take 1 tablet PO once for yeast infection, repeat in 3 days if symptoms are not completely improved 03/17/24   Kenidy Crossland A, PA-C  HYDROcodone -acetaminophen  (NORCO/VICODIN) 5-325 MG tablet Take 1 tablet by mouth every 6 (six) hours as needed for severe pain (pain score 7-10). 01/30/24   Geiple, Joshua, PA-C  naproxen  (NAPROSYN ) 500 MG tablet Take 1 tablet (500 mg total) by mouth 2 (two) times daily with a meal. 12/21/22   Jerri Kay HERO, MD  phentermine  37.5 MG capsule Take 1 capsule (37.5 mg total) by mouth every  morning. 05/26/21   Doretha Folks, MD  predniSONE  (STERAPRED UNI-PAK 21 TAB) 10 MG (21) TBPK tablet Take as directed 12/21/22   Jerri Kay HERO, MD    Allergies: Penicillins and Soap    Review of Systems  Constitutional: Negative.   HENT:  Positive for dental problem. Negative for congestion, ear pain, facial swelling, mouth sores, sore throat, trouble swallowing and voice change.   Cardiovascular: Negative.   Gastrointestinal: Negative.   Genitourinary: Negative.   Musculoskeletal: Negative.   Skin:  Positive for rash.  Neurological: Negative.   All other systems reviewed and are negative.   Updated Vital Signs BP 114/79 (BP Location: Left Arm)   Pulse 88   Temp 97.8 F (36.6 C)   Resp 16   LMP 05/19/2014   SpO2 98%   Physical Exam Vitals and nursing note reviewed.  Constitutional:      General: She is not in acute distress.    Appearance: She is well-developed. She is not ill-appearing, toxic-appearing or diaphoretic.  HENT:     Head: Atraumatic.     Jaw: There is normal jaw occlusion.     Comments: No drooling, no change or trismus.  No facial swelling.    Mouth/Throat:     Lips: Pink.     Mouth: Mucous membranes are moist.     Dentition: Abnormal dentition. Dental tenderness, gingival swelling and  dental caries present. No dental abscesses.     Pharynx: Oropharynx is clear. Uvula midline.     Tonsils: No tonsillar exudate or tonsillar abscesses.      Comments: Posterior pharynx clear.  Sublingual area soft.  No pooling of secretions.  Tenderness to left lower posterior dentition surrounding tooth with crown.  No drainable abscess Eyes:     Pupils: Pupils are equal, round, and reactive to light.  Cardiovascular:     Rate and Rhythm: Normal rate.  Pulmonary:     Effort: No respiratory distress.  Abdominal:     General: There is no distension.  Musculoskeletal:        General: Normal range of motion.     Cervical back: Normal range of motion.  Skin:    General:  Skin is warm and dry.     Capillary Refill: Capillary refill takes less than 2 seconds.     Comments: No rash to palms or soles.  Pruritic rash with excoriations and papules bilateral inner thigh, right inner biceps, left forearm.  No target lesion, bulla, vesicles, fluctuance, induration, desquamated skin.  Neurological:     General: No focal deficit present.     Mental Status: She is alert.  Psychiatric:        Mood and Affect: Mood normal.     (all labs ordered are listed, but only abnormal results are displayed) Labs Reviewed - No data to display  EKG: None  Radiology: No results found.   Procedures   Medications Ordered in the ED - No data to display   Patient here with rash after being outside and touching a bush while cleaning a cooler.  Patient on exam appears to have contact dermatitis. Patient denies any difficulty breathing or swallowing.  Pt has a patent airway without stridor and is handling secretions without difficulty; no angioedema.  No rash in her mucosa, palms or soles.  No blisters, no pustules, no warmth, no draining sinus tracts, no superficial abscesses, no bullous impetigo, no vesicles, no desquamation, no target lesions with dusky purpura or a central bulla. Not tender to touch. No concern for superimposed infection. No concern for SJS, TEN, TSS, tick borne illness, syphilis or other life-threatening condition. Will discharge home with short course of topical steroids, hydroxyzine .  She does also mention that she is having some dental pain to her left lower dentition and a tooth surrounding a crown.  She states she has had recurrent issues with this tooth.  Here she has no evidence of Ludwig's angina, deep space infection, necrotizing gingival infection. Her submental area is soft.  She has no inner mucosal rash/ lesions.  Will treat with antibiotics.  She needs to follow-up with dentistry for this area.  The patient has been appropriately medically screened  and/or stabilized in the ED. I have low suspicion for any other emergent medical condition which would require further screening, evaluation or treatment in the ED or require inpatient management.  Patient is hemodynamically stable and in no acute distress.  Patient able to ambulate in department prior to ED.  Evaluation does not show acute pathology that would require ongoing or additional emergent interventions while in the emergency department or further inpatient treatment.  I have discussed the diagnosis with the patient and answered all questions.  Pain is been managed while in the emergency department and patient has no further complaints prior to discharge.  Patient is comfortable with plan discussed in room and is stable for discharge at this  time.  I have discussed strict return precautions for returning to the emergency department.  Patient was encouraged to follow-up with PCP/specialist refer to at discharge.                                   Medical Decision Making Amount and/or Complexity of Data Reviewed External Data Reviewed: labs, radiology and notes.  Risk OTC drugs. Prescription drug management. Decision regarding hospitalization. Diagnosis or treatment significantly limited by social determinants of health.       Final diagnoses:  Irritant contact dermatitis, unspecified trigger  Dental infection    ED Discharge Orders          Ordered    clindamycin  (CLEOCIN ) 300 MG capsule  Every 6 hours        03/17/24 1915    hydrocortisone 2.5 % lotion  2 times daily        03/17/24 1915    fluconazole  (DIFLUCAN ) 150 MG tablet        03/17/24 1915    hydrOXYzine  (ATARAX ) 25 MG tablet  Every 6 hours        03/17/24 1915               Nathon Stefanski A, PA-C 03/17/24 2000  "

## 2024-03-17 NOTE — ED Triage Notes (Signed)
 Itchy rash noted to R bicep, BIL inner thighs for 1 week. States thinks its from cleaning out cooler outside
